# Patient Record
Sex: Female | Born: 1985 | Race: White | Hispanic: No | Smoking: Never smoker
Health system: Southern US, Community
[De-identification: ages and names within clinical notes are randomized; demographics above are authoritative.]

## PROBLEM LIST (undated history)

## (undated) DIAGNOSIS — B009 Herpesviral infection, unspecified: Secondary | ICD-10-CM

## (undated) DIAGNOSIS — E162 Hypoglycemia, unspecified: Secondary | ICD-10-CM

## (undated) DIAGNOSIS — F419 Anxiety disorder, unspecified: Secondary | ICD-10-CM

## (undated) DIAGNOSIS — Z973 Presence of spectacles and contact lenses: Secondary | ICD-10-CM

## (undated) DIAGNOSIS — D649 Anemia, unspecified: Secondary | ICD-10-CM

## (undated) DIAGNOSIS — J069 Acute upper respiratory infection, unspecified: Secondary | ICD-10-CM

## (undated) HISTORY — DX: Anxiety disorder, unspecified: F41.9

## (undated) HISTORY — PX: TONSILLECTOMY: SUR1361

---
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
## 2004-01-22 ENCOUNTER — Other Ambulatory Visit: Admission: RE | Admit: 2004-01-22 | Discharge: 2004-01-22 | Payer: Self-pay | Admitting: *Deleted

## 2006-02-07 ENCOUNTER — Other Ambulatory Visit: Admission: RE | Admit: 2006-02-07 | Discharge: 2006-02-07 | Payer: Self-pay | Admitting: *Deleted

## 2006-03-30 ENCOUNTER — Other Ambulatory Visit: Admission: RE | Admit: 2006-03-30 | Discharge: 2006-03-30 | Payer: Self-pay | Admitting: *Deleted

## 2006-08-02 ENCOUNTER — Other Ambulatory Visit: Admission: RE | Admit: 2006-08-02 | Discharge: 2006-08-02 | Payer: Self-pay | Admitting: *Deleted

## 2007-02-28 ENCOUNTER — Other Ambulatory Visit: Admission: RE | Admit: 2007-02-28 | Discharge: 2007-02-28 | Payer: Self-pay | Admitting: *Deleted

## 2007-04-13 ENCOUNTER — Other Ambulatory Visit: Admission: RE | Admit: 2007-04-13 | Discharge: 2007-04-13 | Payer: Self-pay | Admitting: *Deleted

## 2008-02-26 ENCOUNTER — Other Ambulatory Visit: Admission: RE | Admit: 2008-02-26 | Discharge: 2008-02-26 | Payer: Self-pay | Admitting: *Deleted

## 2012-07-03 ENCOUNTER — Ambulatory Visit: Payer: Self-pay | Admitting: Orthopedic Surgery

## 2012-07-26 NOTE — L&D Delivery Note (Signed)
Delivery Note At 7:57 PM a viable female was delivered via Vaginal, Spontaneous Delivery (Presentation: Right Occiput Anterior).  APGAR: , ; weight .   Placenta status:spont >>intact , .  Cord:  with the following complications: .  Cord pH: not sent  Anesthesia: Epidural  Episiotomy: None Lacerations: 2nd degree Suture Repair: 3.0 vicryl rapide Est. Blood Loss (mL): 300  Mom to postpartum.  Baby to nursery-stable.  Meriel Pica 04/16/2013, 8:23 PM

## 2013-01-22 ENCOUNTER — Ambulatory Visit: Payer: BC Managed Care – PPO | Attending: Obstetrics & Gynecology | Admitting: Physical Therapy

## 2013-01-22 DIAGNOSIS — R5381 Other malaise: Secondary | ICD-10-CM | POA: Insufficient documentation

## 2013-01-22 DIAGNOSIS — M546 Pain in thoracic spine: Secondary | ICD-10-CM | POA: Insufficient documentation

## 2013-01-22 DIAGNOSIS — IMO0001 Reserved for inherently not codable concepts without codable children: Secondary | ICD-10-CM | POA: Insufficient documentation

## 2013-01-23 ENCOUNTER — Ambulatory Visit: Payer: BC Managed Care – PPO | Admitting: Physical Therapy

## 2013-02-01 ENCOUNTER — Ambulatory Visit: Payer: BC Managed Care – PPO | Attending: Obstetrics & Gynecology | Admitting: Physical Therapy

## 2013-02-09 ENCOUNTER — Encounter: Payer: BC Managed Care – PPO | Admitting: Physical Therapy

## 2013-02-16 ENCOUNTER — Encounter: Payer: BC Managed Care – PPO | Admitting: Physical Therapy

## 2013-02-23 ENCOUNTER — Encounter: Payer: BC Managed Care – PPO | Admitting: Physical Therapy

## 2013-03-01 ENCOUNTER — Encounter: Payer: BC Managed Care – PPO | Admitting: Physical Therapy

## 2013-04-15 ENCOUNTER — Inpatient Hospital Stay (HOSPITAL_COMMUNITY)
Admission: AD | Admit: 2013-04-15 | Discharge: 2013-04-15 | Disposition: A | Discharge status: Home / Self Care | Payer: BC Managed Care – PPO | Source: Ambulatory Visit | Attending: Obstetrics & Gynecology | Admitting: Obstetrics & Gynecology

## 2013-04-15 ENCOUNTER — Encounter (HOSPITAL_COMMUNITY): Payer: Self-pay | Admitting: *Deleted

## 2013-04-15 DIAGNOSIS — O479 False labor, unspecified: Secondary | ICD-10-CM | POA: Insufficient documentation

## 2013-04-15 LAB — POCT FERN TEST: POCT Fern Test: NEGATIVE

## 2013-04-15 NOTE — MAU Note (Signed)
Pt reoprts uc's q 5-8 minutes

## 2013-04-15 NOTE — MAU Note (Signed)
Pt presents with complaint of contractions and a watery discharge, denies gush of fluid. Denies problems with pregnancy.

## 2013-04-16 ENCOUNTER — Inpatient Hospital Stay (HOSPITAL_COMMUNITY): Payer: BC Managed Care – PPO | Admitting: Anesthesiology

## 2013-04-16 ENCOUNTER — Encounter (HOSPITAL_COMMUNITY): Payer: Self-pay | Admitting: Anesthesiology

## 2013-04-16 ENCOUNTER — Inpatient Hospital Stay (HOSPITAL_COMMUNITY)
Admission: AD | Admit: 2013-04-16 | Discharge: 2013-04-18 | DRG: 373 | Disposition: A | Discharge status: Home / Self Care | Payer: BC Managed Care – PPO | Source: Ambulatory Visit | Attending: Obstetrics and Gynecology | Admitting: Obstetrics and Gynecology

## 2013-04-16 ENCOUNTER — Encounter (HOSPITAL_COMMUNITY): Payer: Self-pay | Admitting: *Deleted

## 2013-04-16 LAB — OB RESULTS CONSOLE ANTIBODY SCREEN: Antibody Screen: NEGATIVE

## 2013-04-16 LAB — CBC
HCT: 37.5 % (ref 36.0–46.0)
Hemoglobin: 13 g/dL (ref 12.0–15.0)
MCH: 29.8 pg (ref 26.0–34.0)
MCV: 86 fL (ref 78.0–100.0)
RDW: 13.3 % (ref 11.5–15.5)
WBC: 11.3 10*3/uL — ABNORMAL HIGH (ref 4.0–10.5)

## 2013-04-16 LAB — RPR: RPR Ser Ql: NONREACTIVE

## 2013-04-16 LAB — OB RESULTS CONSOLE ABO/RH

## 2013-04-16 LAB — OB RESULTS CONSOLE GBS: GBS: NEGATIVE

## 2013-04-16 LAB — OB RESULTS CONSOLE RPR: RPR: NONREACTIVE

## 2013-04-16 LAB — OB RESULTS CONSOLE HEPATITIS B SURFACE ANTIGEN: Hepatitis B Surface Ag: NEGATIVE

## 2013-04-16 LAB — OB RESULTS CONSOLE GC/CHLAMYDIA
Chlamydia: NEGATIVE
Gonorrhea: NEGATIVE

## 2013-04-16 LAB — OB RESULTS CONSOLE RUBELLA ANTIBODY, IGM: Rubella: IMMUNE

## 2013-04-16 MED ORDER — OXYTOCIN 40 UNITS IN LACTATED RINGERS INFUSION - SIMPLE MED
1.0000 m[IU]/min | INTRAVENOUS | Status: DC
  Administered 2013-04-16: 2 m[IU]/min via INTRAVENOUS

## 2013-04-16 MED ORDER — ONDANSETRON HCL 4 MG/2ML IJ SOLN
4.0000 mg | Freq: Four times a day (QID) | INTRAMUSCULAR | Status: DC | PRN
  Administered 2013-04-16: 4 mg via INTRAVENOUS
  Filled 2013-04-16 (×2): qty 2

## 2013-04-16 MED ORDER — BUPIVACAINE HCL (PF) 0.25 % IJ SOLN
INTRAMUSCULAR | Status: DC | PRN
  Administered 2013-04-16: 5 mL via EPIDURAL

## 2013-04-16 MED ORDER — ACETAMINOPHEN 325 MG PO TABS: 650.0000 mg | ORAL_TABLET | ORAL | Status: DC | PRN

## 2013-04-16 MED ORDER — ONDANSETRON HCL 4 MG PO TABS: 4.0000 mg | ORAL_TABLET | ORAL | Status: DC | PRN

## 2013-04-16 MED ORDER — LACTATED RINGERS IV SOLN: 500.0000 mL | INTRAVENOUS | Status: DC | PRN

## 2013-04-16 MED ORDER — BISACODYL 10 MG RE SUPP: 10.0000 mg | Freq: Every day | RECTAL | Status: DC | PRN

## 2013-04-16 MED ORDER — TETANUS-DIPHTH-ACELL PERTUSSIS 5-2.5-18.5 LF-MCG/0.5 IM SUSP: 0.5000 mL | Freq: Once | INTRAMUSCULAR | Status: DC

## 2013-04-16 MED ORDER — FLEET ENEMA 7-19 GM/118ML RE ENEM: 1.0000 | ENEMA | Freq: Every day | RECTAL | Status: DC | PRN

## 2013-04-16 MED ORDER — CITRIC ACID-SODIUM CITRATE 334-500 MG/5ML PO SOLN: 30.0000 mL | ORAL | Status: DC | PRN

## 2013-04-16 MED ORDER — SENNOSIDES-DOCUSATE SODIUM 8.6-50 MG PO TABS
2.0000 | ORAL_TABLET | ORAL | Status: DC
  Administered 2013-04-17: 2 via ORAL

## 2013-04-16 MED ORDER — IBUPROFEN 800 MG PO TABS
800.0000 mg | ORAL_TABLET | Freq: Three times a day (TID) | ORAL | Status: DC | PRN
  Administered 2013-04-16 – 2013-04-18 (×5): 800 mg via ORAL
  Filled 2013-04-16 (×6): qty 1

## 2013-04-16 MED ORDER — BENZOCAINE-MENTHOL 20-0.5 % EX AERO
1.0000 "application " | INHALATION_SPRAY | CUTANEOUS | Status: DC | PRN
  Administered 2013-04-17: 1 via TOPICAL
  Filled 2013-04-16: qty 56

## 2013-04-16 MED ORDER — WITCH HAZEL-GLYCERIN EX PADS: 1.0000 "application " | MEDICATED_PAD | CUTANEOUS | Status: DC | PRN

## 2013-04-16 MED ORDER — SODIUM CHLORIDE 0.9 % IJ SOLN: 3.0000 mL | INTRAMUSCULAR | Status: DC | PRN

## 2013-04-16 MED ORDER — OXYCODONE-ACETAMINOPHEN 5-325 MG PO TABS: 1.0000 | ORAL_TABLET | Freq: Four times a day (QID) | ORAL | Status: DC | PRN

## 2013-04-16 MED ORDER — EPHEDRINE 5 MG/ML INJ
10.0000 mg | INTRAVENOUS | Status: DC | PRN
  Filled 2013-04-16: qty 2

## 2013-04-16 MED ORDER — DIPHENHYDRAMINE HCL 50 MG/ML IJ SOLN: 12.5000 mg | INTRAMUSCULAR | Status: DC | PRN

## 2013-04-16 MED ORDER — OXYTOCIN BOLUS FROM INFUSION
500.0000 mL | INTRAVENOUS | Status: DC
  Administered 2013-04-16: 500 mL via INTRAVENOUS

## 2013-04-16 MED ORDER — PHENYLEPHRINE 40 MCG/ML (10ML) SYRINGE FOR IV PUSH (FOR BLOOD PRESSURE SUPPORT)
80.0000 ug | PREFILLED_SYRINGE | INTRAVENOUS | Status: DC | PRN
  Filled 2013-04-16: qty 2
  Filled 2013-04-16 (×2): qty 5

## 2013-04-16 MED ORDER — LANOLIN HYDROUS EX OINT: TOPICAL_OINTMENT | CUTANEOUS | Status: DC | PRN

## 2013-04-16 MED ORDER — DIBUCAINE 1 % RE OINT: 1.0000 "application " | TOPICAL_OINTMENT | RECTAL | Status: DC | PRN

## 2013-04-16 MED ORDER — DIPHENHYDRAMINE HCL 25 MG PO CAPS: 25.0000 mg | ORAL_CAPSULE | Freq: Four times a day (QID) | ORAL | Status: DC | PRN

## 2013-04-16 MED ORDER — OXYCODONE-ACETAMINOPHEN 5-325 MG PO TABS: 1.0000 | ORAL_TABLET | ORAL | Status: DC | PRN

## 2013-04-16 MED ORDER — IBUPROFEN 600 MG PO TABS: 600.0000 mg | ORAL_TABLET | Freq: Four times a day (QID) | ORAL | Status: DC | PRN

## 2013-04-16 MED ORDER — LACTATED RINGERS IV SOLN
INTRAVENOUS | Status: DC
  Administered 2013-04-16 (×3): via INTRAVENOUS

## 2013-04-16 MED ORDER — EPHEDRINE 5 MG/ML INJ
10.0000 mg | INTRAVENOUS | Status: DC | PRN
  Filled 2013-04-16: qty 4
  Filled 2013-04-16: qty 2
  Filled 2013-04-16: qty 4

## 2013-04-16 MED ORDER — LACTATED RINGERS IV SOLN: 500.0000 mL | Freq: Once | INTRAVENOUS | Status: DC

## 2013-04-16 MED ORDER — PHENYLEPHRINE 40 MCG/ML (10ML) SYRINGE FOR IV PUSH (FOR BLOOD PRESSURE SUPPORT)
80.0000 ug | PREFILLED_SYRINGE | INTRAVENOUS | Status: DC | PRN
  Filled 2013-04-16: qty 2

## 2013-04-16 MED ORDER — FENTANYL 2.5 MCG/ML BUPIVACAINE 1/10 % EPIDURAL INFUSION (WH - ANES)
14.0000 mL/h | INTRAMUSCULAR | Status: DC | PRN
  Administered 2013-04-16 (×2): 14 mL/h via EPIDURAL
  Filled 2013-04-16 (×2): qty 125

## 2013-04-16 MED ORDER — ZOLPIDEM TARTRATE 5 MG PO TABS: 5.0000 mg | ORAL_TABLET | Freq: Every evening | ORAL | Status: DC | PRN

## 2013-04-16 MED ORDER — LIDOCAINE HCL (PF) 1 % IJ SOLN
INTRAMUSCULAR | Status: DC | PRN
  Administered 2013-04-16 (×2): 5 mL

## 2013-04-16 MED ORDER — ONDANSETRON HCL 4 MG/2ML IJ SOLN: 4.0000 mg | INTRAMUSCULAR | Status: DC | PRN

## 2013-04-16 MED ORDER — OXYTOCIN 40 UNITS IN LACTATED RINGERS INFUSION - SIMPLE MED
62.5000 mL/h | INTRAVENOUS | Status: DC
  Administered 2013-04-16: 62.5 mL/h via INTRAVENOUS
  Filled 2013-04-16: qty 1000

## 2013-04-16 MED ORDER — SIMETHICONE 80 MG PO CHEW: 80.0000 mg | CHEWABLE_TABLET | ORAL | Status: DC | PRN

## 2013-04-16 MED ORDER — LIDOCAINE HCL (PF) 1 % IJ SOLN
30.0000 mL | INTRAMUSCULAR | Status: DC | PRN
  Filled 2013-04-16 (×2): qty 30

## 2013-04-16 MED ORDER — TERBUTALINE SULFATE 1 MG/ML IJ SOLN: 0.2500 mg | Freq: Once | INTRAMUSCULAR | Status: DC | PRN

## 2013-04-16 MED ORDER — MEASLES, MUMPS & RUBELLA VAC ~~LOC~~ INJ: 0.5000 mL | INJECTION | Freq: Once | SUBCUTANEOUS | Status: DC

## 2013-04-16 MED ORDER — HYDROCORTISONE 1 % EX CREA
TOPICAL_CREAM | Freq: Three times a day (TID) | CUTANEOUS | Status: DC | PRN
  Filled 2013-04-16: qty 28

## 2013-04-16 MED ORDER — PRENATAL MULTIVITAMIN CH
1.0000 | ORAL_TABLET | Freq: Every day | ORAL | Status: DC
  Administered 2013-04-17: 1 via ORAL
  Filled 2013-04-16: qty 1

## 2013-04-16 NOTE — H&P (Signed)
NAME:  Taylor Henry, Taylor Henry             ACCOUNT NO.:  [id number]  MEDICAL RECORD NO.:  [id number]  LOCATION:  9166                          FACILITY:  [facility]  PHYSICIAN:  Duke Salvia. Marcelle Overlie, M.D.DATE OF BIRTH:  12/04/1985  DATE OF ADMISSION:  04/16/2013 DATE OF DISCHARGE:                             HISTORY & PHYSICAL   CHIEF COMPLAINT:  Rupture of membranes with early labor at term.  HPI:  A 26 year old, G1, P0, EDD September 20, presents with SROM, clear fluid, early labor, uncomplicated pregnancy with GBS negative.  PAST MEDICAL HISTORY:  Please see the [facility] form for details.  ALLERGIES:  Penicillin and codeine.  PHYSICAL EXAMINATION:  VITAL SIGNS:  Temp 98.2, blood pressure 120/78. HEENT:  Unremarkable. NECK:  Supple without masses. LUNGS:  Clear. CARDIOVASCULAR:  Regular rate and rhythm without murmurs, rubs, gallops. BREASTS:  Not examined.  Term fundal height.  Fetal heart rate 140, cervix was 2, 50%, copious clear fluid, vertex. EXTREMITIES:  Unremarkable. NEUROLOGIC:  Unremarkable.  IMPRESSION:  Term pregnancy, spontaneous rupture of membranes with early labor.     Kyaire Gruenewald M. Marcelle Overlie, M.D.     [facility]/MEDQ  D:  04/16/2013  T:  04/16/2013  Job:  [id number]

## 2013-04-16 NOTE — Anesthesia Preprocedure Evaluation (Signed)

## 2013-04-16 NOTE — MAU Note (Signed)
Patient states she started leaking clear fluid at 0930 and continues to leak. Pants saturated on arrival to MAU.  On arrival to MAU. Patient states she is having contractions every 2 minutes. Reports good fetal movement.

## 2013-04-16 NOTE — Anesthesia Procedure Notes (Signed)

## 2013-04-16 NOTE — H&P (Signed)
Taylor Henry  DICTATION # [id number] CSN# [id number]   Meriel Pica, MD 04/16/2013 1:04 PM

## 2013-04-16 NOTE — MAU Note (Signed)
Report given to Erin, CN on BS. Patient will be a throughput to room 166.

## 2013-04-16 NOTE — Progress Notes (Signed)
Now 3/C/-1, clr AF, has epid>>comfortable

## 2013-04-17 LAB — CBC
HCT: 33.6 % — ABNORMAL LOW (ref 36.0–46.0)
Platelets: 155 10*3/uL (ref 150–400)
RBC: 3.85 MIL/uL — ABNORMAL LOW (ref 3.87–5.11)
WBC: 15.6 10*3/uL — ABNORMAL HIGH (ref 4.0–10.5)

## 2013-04-17 NOTE — Anesthesia Postprocedure Evaluation (Signed)
Anesthesia Post Note  Patient: Taylor Henry  Procedure(s) Performed: * No procedures listed *  Anesthesia type: Epidural  Patient location: Mother/Baby  Post pain: Pain level controlled  Post assessment: Post-op Vital signs reviewed  Last Vitals:  Filed Vitals:   04/17/13 0628  BP: 109/71  Pulse: 78  Temp: 36.6 C  Resp: 18    Post vital signs: Reviewed  Level of consciousness:alert  Complications: No apparent anesthesia complications 

## 2013-04-17 NOTE — Anesthesia Postprocedure Evaluation (Signed)
Anesthesia Post Note  Patient: Taylor Henry  Procedure(s) Performed: * No procedures listed *  Anesthesia type: Epidural  Patient location: Mother/Baby  Post pain: Pain level controlled  Post assessment: Post-op Vital signs reviewed  Last Vitals:  Filed Vitals:   04/17/13 0628  BP: 109/71  Pulse: 78  Temp: 36.6 C  Resp: 18    Post vital signs: Reviewed  Level of consciousness:alert  Complications: No apparent anesthesia complications

## 2013-04-17 NOTE — Progress Notes (Signed)
Post Partum Day 1 Subjective: no complaints, up ad lib, voiding and tolerating PO  Objective: Blood pressure 109/71, pulse 78, temperature 97.9 F (36.6 C), temperature source Oral, resp. rate 18, height 5\' 6"  (1.676 m), weight 181 lb (82.101 kg), SpO2 95.00%, unknown if currently breastfeeding.  Physical Exam:  General: alert and cooperative Lochia: appropriate Uterine Fundus: firm Incision: perineum intact DVT Evaluation: No evidence of DVT seen on physical exam. Negative Homan's sign. No cords or calf tenderness.   Recent Labs  04/16/13 1130 04/17/13 0600  HGB 13.0 11.5*  HCT 37.5 33.6*    Assessment/Plan: Plan for discharge tomorrow   LOS: 1 day   CURTIS,CAROL G 04/17/2013, 8:40 AM

## 2013-04-18 ENCOUNTER — Inpatient Hospital Stay (HOSPITAL_COMMUNITY): Admission: RE | Admit: 2013-04-18 | Payer: BC Managed Care – PPO | Source: Ambulatory Visit

## 2013-04-18 MED ORDER — IBUPROFEN 800 MG PO TABS
800.0000 mg | ORAL_TABLET | Freq: Three times a day (TID) | ORAL | Status: DC | PRN
Start: 2013-04-18 — End: 2016-03-27

## 2013-04-18 MED ORDER — OXYCODONE-ACETAMINOPHEN 5-325 MG PO TABS: 1.0000 | ORAL_TABLET | Freq: Four times a day (QID) | ORAL | Status: DC | PRN

## 2013-04-18 NOTE — Discharge Summary (Signed)
Obstetric Discharge Summary Reason for Admission: onset of labor and rupture of membranes Prenatal Procedures: ultrasound Intrapartum Procedures: spontaneous vaginal delivery Postpartum Procedures: none Complications-Operative and Postpartum: 2 degree perineal laceration Hemoglobin  Date Value Range Status  04/17/2013 11.5* 12.0 - 15.0 g/dL Final     HCT  Date Value Range Status  04/17/2013 33.6* 36.0 - 46.0 % Final    Physical Exam:  General: alert and cooperative Lochia: appropriate Uterine Fundus: firm Incision: perineum intact DVT Evaluation: No evidence of DVT seen on physical exam. Negative Homan's sign. No cords or calf tenderness. No significant calf/ankle edema.  Discharge Diagnoses: Term Pregnancy-delivered  Discharge Information: Date: 04/18/2013 Activity: pelvic rest Diet: routine Medications: PNV, Ibuprofen and Percocet Condition: stable Instructions: refer to practice specific booklet Discharge to: home   Newborn Data: Live born female  Birth Weight: 8 lb 0.8 oz (3650 g) APGAR: 7, 9  Home with mother.  Taylor Henry G 04/18/2013, 8:33 AM

## 2014-05-27 ENCOUNTER — Encounter (HOSPITAL_COMMUNITY): Payer: Self-pay | Admitting: *Deleted

## 2015-07-27 NOTE — L&D Delivery Note (Signed)
Delivery Note At 5:50 PM a viable female was delivered via Vaginal, Spontaneous Delivery (presentation: vertex).  APGAR: 9, 9; weight 9 lb 10.3 oz (4374 g).   Placenta status: routine Tight Nuchal Cord: delivered through  With no complications.   Anesthesia:   Episiotomy: None Lacerations: 2nd degree Suture Repair: 2.0 3.0 vicryl rapide Est. Blood Loss (mL): 300  Mom to postpartum.  Baby to Couplet care / Skin to Skin.  It's a boy "Taylor Henry"!!  [name] [name] 03/26/2016, 6:53 PM

## 2015-09-03 LAB — OB RESULTS CONSOLE HEPATITIS B SURFACE ANTIGEN: HEP B S AG: NEGATIVE

## 2015-09-03 LAB — OB RESULTS CONSOLE ANTIBODY SCREEN: Antibody Screen: NEGATIVE

## 2015-09-03 LAB — OB RESULTS CONSOLE RUBELLA ANTIBODY, IGM: RUBELLA: IMMUNE

## 2015-09-03 LAB — OB RESULTS CONSOLE ABO/RH: RH Type: POSITIVE

## 2015-09-03 LAB — OB RESULTS CONSOLE RPR: RPR: NONREACTIVE

## 2015-09-03 LAB — OB RESULTS CONSOLE HIV ANTIBODY (ROUTINE TESTING): HIV: NONREACTIVE

## 2015-09-03 LAB — OB RESULTS CONSOLE GC/CHLAMYDIA
CHLAMYDIA, DNA PROBE: NEGATIVE
GC PROBE AMP, GENITAL: NEGATIVE

## 2015-10-31 DIAGNOSIS — Z3A18 18 weeks gestation of pregnancy: Secondary | ICD-10-CM | POA: Diagnosis not present

## 2015-10-31 DIAGNOSIS — Z36 Encounter for antenatal screening of mother: Secondary | ICD-10-CM | POA: Diagnosis not present

## 2015-12-29 DIAGNOSIS — D509 Iron deficiency anemia, unspecified: Secondary | ICD-10-CM | POA: Diagnosis not present

## 2015-12-29 DIAGNOSIS — Z3A27 27 weeks gestation of pregnancy: Secondary | ICD-10-CM | POA: Diagnosis not present

## 2015-12-29 DIAGNOSIS — O4402 Placenta previa specified as without hemorrhage, second trimester: Secondary | ICD-10-CM | POA: Diagnosis not present

## 2015-12-29 DIAGNOSIS — Z23 Encounter for immunization: Secondary | ICD-10-CM | POA: Diagnosis not present

## 2015-12-29 DIAGNOSIS — Z36 Encounter for antenatal screening of mother: Secondary | ICD-10-CM | POA: Diagnosis not present

## 2016-02-11 DIAGNOSIS — Z3A33 33 weeks gestation of pregnancy: Secondary | ICD-10-CM | POA: Diagnosis not present

## 2016-02-11 DIAGNOSIS — O3663X0 Maternal care for excessive fetal growth, third trimester, not applicable or unspecified: Secondary | ICD-10-CM | POA: Diagnosis not present

## 2016-02-25 DIAGNOSIS — Z36 Encounter for antenatal screening of mother: Secondary | ICD-10-CM | POA: Diagnosis not present

## 2016-02-25 DIAGNOSIS — Z3A35 35 weeks gestation of pregnancy: Secondary | ICD-10-CM | POA: Diagnosis not present

## 2016-03-10 DIAGNOSIS — O3663X0 Maternal care for excessive fetal growth, third trimester, not applicable or unspecified: Secondary | ICD-10-CM | POA: Diagnosis not present

## 2016-03-10 DIAGNOSIS — Z3A37 37 weeks gestation of pregnancy: Secondary | ICD-10-CM | POA: Diagnosis not present

## 2016-03-24 ENCOUNTER — Telehealth (HOSPITAL_COMMUNITY): Payer: Self-pay | Admitting: *Deleted

## 2016-03-24 ENCOUNTER — Encounter (HOSPITAL_COMMUNITY): Payer: Self-pay | Admitting: *Deleted

## 2016-03-24 LAB — OB RESULTS CONSOLE GBS: GBS: POSITIVE

## 2016-03-24 NOTE — Telephone Encounter (Signed)
Preadmission screen  

## 2016-03-25 ENCOUNTER — Encounter (HOSPITAL_COMMUNITY): Payer: Self-pay

## 2016-03-25 NOTE — H&P (Signed)
Taylor Henry is a 30 y.o. female presenting for multip IOL w/favorable cervix. Prior NSVD in 2014 of 8#0 infant. OB History    Gravida Para Term Preterm AB Living   2 1 1     1    SAB TAB Ectopic Multiple Live Births           1     Past Medical History:  Diagnosis Date  . HSV (herpes simplex virus) infection    ORAL   Past Surgical History:  Procedure Laterality Date  . ADENOIDECTOMY  1991  . TONSILLECTOMY     Family History: family history includes Heart disease in her paternal grandmother; Hodgkin's lymphoma in her mother; Hyperlipidemia in her father; Hypertension in her father and paternal grandmother. Social History:  reports that she has never smoked. She has never used smokeless tobacco. She reports that she does not drink alcohol or use drugs.     Maternal Diabetes: No Genetic Screening: Normal Maternal Ultrasounds/Referrals: Normal, LLP resolved Fetal Ultrasounds or other Referrals:  None Maternal Substance Abuse:  No Significant Maternal Medications:  None Significant Maternal Lab Results:  None Other Comments:  None  ROS WNL History here for IOL   Last menstrual period 06/22/2015, unknown if currently breastfeeding. Exam Physical Exam  OBGyn Exam  (from clinic) NAD, A&O NWOB Abd soft, nondistended, gravid  Prenatal labs: ABO, Rh: O/Positive/-- (02/08 0000) Antibody: Negative (02/08 0000) Rubella: Immune (02/08 0000) RPR: Nonreactive (02/08 0000)  HBsAg: Negative (02/08 0000)  HIV: Non-reactive (02/08 0000)  GBS: Positive (08/30 0000)   Assessment/Plan: A/P: Pt is a G2P1001 here for scheduled multip IOL.  IOL: favorable cervix - SVE 3/25/-3 in office. Plan for pitocin/AROM MWB: prior FT NSVD 8#0, otherwise no sig med/surg issues GBS pos, PCN allergy, plan for clindamycin as it is sens to it.  Taylor Henry   Taylor Henry 03/25/2016, 4:50 PM

## 2016-03-26 ENCOUNTER — Inpatient Hospital Stay (HOSPITAL_COMMUNITY): Payer: BLUE CROSS/BLUE SHIELD | Admitting: Anesthesiology

## 2016-03-26 ENCOUNTER — Inpatient Hospital Stay (HOSPITAL_COMMUNITY)
Admission: RE | Admit: 2016-03-26 | Discharge: 2016-03-27 | DRG: 775 | Disposition: A | Discharge status: Home / Self Care | Payer: BLUE CROSS/BLUE SHIELD | Source: Ambulatory Visit | Attending: Obstetrics and Gynecology | Admitting: Obstetrics and Gynecology

## 2016-03-26 DIAGNOSIS — Z8249 Family history of ischemic heart disease and other diseases of the circulatory system: Secondary | ICD-10-CM | POA: Diagnosis not present

## 2016-03-26 DIAGNOSIS — O99824 Streptococcus B carrier state complicating childbirth: Secondary | ICD-10-CM | POA: Diagnosis not present

## 2016-03-26 DIAGNOSIS — Z23 Encounter for immunization: Secondary | ICD-10-CM | POA: Diagnosis not present

## 2016-03-26 DIAGNOSIS — Z880 Allergy status to penicillin: Secondary | ICD-10-CM

## 2016-03-26 DIAGNOSIS — Z3483 Encounter for supervision of other normal pregnancy, third trimester: Secondary | ICD-10-CM | POA: Diagnosis present

## 2016-03-26 DIAGNOSIS — Z3490 Encounter for supervision of normal pregnancy, unspecified, unspecified trimester: Secondary | ICD-10-CM

## 2016-03-26 DIAGNOSIS — Z3A39 39 weeks gestation of pregnancy: Secondary | ICD-10-CM | POA: Diagnosis not present

## 2016-03-26 DIAGNOSIS — Z3A40 40 weeks gestation of pregnancy: Secondary | ICD-10-CM | POA: Diagnosis not present

## 2016-03-26 HISTORY — DX: Herpesviral infection, unspecified: B00.9

## 2016-03-26 LAB — CBC
HCT: 32.8 % — ABNORMAL LOW (ref 36.0–46.0)
HEMOGLOBIN: 11.1 g/dL — AB (ref 12.0–15.0)
MCH: 27.8 pg (ref 26.0–34.0)
MCHC: 33.8 g/dL (ref 30.0–36.0)
MCV: 82.2 fL (ref 78.0–100.0)
Platelets: 207 10*3/uL (ref 150–400)
RBC: 3.99 MIL/uL (ref 3.87–5.11)
RDW: 13.8 % (ref 11.5–15.5)
WBC: 7.6 10*3/uL (ref 4.0–10.5)

## 2016-03-26 LAB — ABO/RH: ABO/RH(D): O POS

## 2016-03-26 LAB — TYPE AND SCREEN
ABO/RH(D): O POS
Antibody Screen: NEGATIVE

## 2016-03-26 MED ORDER — WITCH HAZEL-GLYCERIN EX PADS: 1.0000 "application " | MEDICATED_PAD | CUTANEOUS | Status: DC | PRN

## 2016-03-26 MED ORDER — LACTATED RINGERS IV SOLN
INTRAVENOUS | Status: DC
  Administered 2016-03-26: 1000 mL via INTRAVENOUS

## 2016-03-26 MED ORDER — SOD CITRATE-CITRIC ACID 500-334 MG/5ML PO SOLN: 30.0000 mL | ORAL | Status: DC | PRN

## 2016-03-26 MED ORDER — DIPHENHYDRAMINE HCL 25 MG PO CAPS: 25.0000 mg | ORAL_CAPSULE | Freq: Four times a day (QID) | ORAL | Status: DC | PRN

## 2016-03-26 MED ORDER — COCONUT OIL OIL: 1.0000 "application " | TOPICAL_OIL | Status: DC | PRN

## 2016-03-26 MED ORDER — ACETAMINOPHEN 325 MG PO TABS: 650.0000 mg | ORAL_TABLET | ORAL | Status: DC | PRN

## 2016-03-26 MED ORDER — OXYCODONE-ACETAMINOPHEN 5-325 MG PO TABS: 2.0000 | ORAL_TABLET | ORAL | Status: DC | PRN

## 2016-03-26 MED ORDER — TETANUS-DIPHTH-ACELL PERTUSSIS 5-2.5-18.5 LF-MCG/0.5 IM SUSP: 0.5000 mL | Freq: Once | INTRAMUSCULAR | Status: DC

## 2016-03-26 MED ORDER — LACTATED RINGERS IV SOLN: 500.0000 mL | INTRAVENOUS | Status: DC | PRN

## 2016-03-26 MED ORDER — OXYCODONE-ACETAMINOPHEN 5-325 MG PO TABS
1.0000 | ORAL_TABLET | ORAL | Status: DC | PRN
Start: 2016-03-26 — End: 2016-03-27

## 2016-03-26 MED ORDER — FENTANYL 2.5 MCG/ML BUPIVACAINE 1/10 % EPIDURAL INFUSION (WH - ANES)
14.0000 mL/h | INTRAMUSCULAR | Status: DC | PRN
  Administered 2016-03-26: 14 mL/h via EPIDURAL
  Filled 2016-03-26 (×2): qty 125

## 2016-03-26 MED ORDER — PRENATAL MULTIVITAMIN CH
1.0000 | ORAL_TABLET | Freq: Every day | ORAL | Status: DC
  Administered 2016-03-27: 1 via ORAL
  Filled 2016-03-26: qty 1

## 2016-03-26 MED ORDER — EPHEDRINE 5 MG/ML INJ
10.0000 mg | INTRAVENOUS | Status: DC | PRN
  Filled 2016-03-26: qty 4

## 2016-03-26 MED ORDER — OXYTOCIN BOLUS FROM INFUSION
500.0000 mL | Freq: Once | INTRAVENOUS | Status: AC
  Administered 2016-03-26: 500 mL via INTRAVENOUS

## 2016-03-26 MED ORDER — CLINDAMYCIN PHOSPHATE 900 MG/50ML IV SOLN
900.0000 mg | Freq: Three times a day (TID) | INTRAVENOUS | Status: DC
  Administered 2016-03-26: 900 mg via INTRAVENOUS
  Filled 2016-03-26 (×2): qty 50

## 2016-03-26 MED ORDER — LIDOCAINE HCL (PF) 1 % IJ SOLN
30.0000 mL | INTRAMUSCULAR | Status: DC | PRN
  Filled 2016-03-26: qty 30

## 2016-03-26 MED ORDER — SENNOSIDES-DOCUSATE SODIUM 8.6-50 MG PO TABS
2.0000 | ORAL_TABLET | ORAL | Status: DC
  Administered 2016-03-26: 2 via ORAL
  Filled 2016-03-26: qty 2

## 2016-03-26 MED ORDER — OXYTOCIN 40 UNITS IN LACTATED RINGERS INFUSION - SIMPLE MED: 2.5000 [IU]/h | INTRAVENOUS | Status: DC

## 2016-03-26 MED ORDER — LIDOCAINE HCL (PF) 1 % IJ SOLN
INTRAMUSCULAR | Status: DC | PRN
  Administered 2016-03-26 (×2): 6 mL

## 2016-03-26 MED ORDER — ONDANSETRON HCL 4 MG/2ML IJ SOLN: 4.0000 mg | INTRAMUSCULAR | Status: DC | PRN

## 2016-03-26 MED ORDER — PHENYLEPHRINE 40 MCG/ML (10ML) SYRINGE FOR IV PUSH (FOR BLOOD PRESSURE SUPPORT)
80.0000 ug | PREFILLED_SYRINGE | INTRAVENOUS | Status: DC | PRN
  Filled 2016-03-26: qty 5
  Filled 2016-03-26: qty 10

## 2016-03-26 MED ORDER — ONDANSETRON HCL 4 MG/2ML IJ SOLN: 4.0000 mg | Freq: Four times a day (QID) | INTRAMUSCULAR | Status: DC | PRN

## 2016-03-26 MED ORDER — DIPHENHYDRAMINE HCL 50 MG/ML IJ SOLN: 12.5000 mg | INTRAMUSCULAR | Status: DC | PRN

## 2016-03-26 MED ORDER — IBUPROFEN 600 MG PO TABS
600.0000 mg | ORAL_TABLET | Freq: Four times a day (QID) | ORAL | Status: DC
  Administered 2016-03-26 – 2016-03-27 (×4): 600 mg via ORAL
  Filled 2016-03-26 (×4): qty 1

## 2016-03-26 MED ORDER — ZOLPIDEM TARTRATE 5 MG PO TABS: 5.0000 mg | ORAL_TABLET | Freq: Every evening | ORAL | Status: DC | PRN

## 2016-03-26 MED ORDER — DIBUCAINE 1 % RE OINT: 1.0000 "application " | TOPICAL_OINTMENT | RECTAL | Status: DC | PRN

## 2016-03-26 MED ORDER — LACTATED RINGERS IV SOLN
500.0000 mL | Freq: Once | INTRAVENOUS | Status: AC
  Administered 2016-03-26: 500 mL via INTRAVENOUS

## 2016-03-26 MED ORDER — BENZOCAINE-MENTHOL 20-0.5 % EX AERO
1.0000 "application " | INHALATION_SPRAY | CUTANEOUS | Status: DC | PRN
  Administered 2016-03-27: 1 via TOPICAL
  Filled 2016-03-26: qty 56

## 2016-03-26 MED ORDER — OXYCODONE-ACETAMINOPHEN 5-325 MG PO TABS: 1.0000 | ORAL_TABLET | ORAL | Status: DC | PRN

## 2016-03-26 MED ORDER — ONDANSETRON HCL 4 MG PO TABS: 4.0000 mg | ORAL_TABLET | ORAL | Status: DC | PRN

## 2016-03-26 MED ORDER — OXYTOCIN 40 UNITS IN LACTATED RINGERS INFUSION - SIMPLE MED
1.0000 m[IU]/min | INTRAVENOUS | Status: DC
  Administered 2016-03-26: 2 m[IU]/min via INTRAVENOUS
  Filled 2016-03-26: qty 1000

## 2016-03-26 MED ORDER — TERBUTALINE SULFATE 1 MG/ML IJ SOLN
0.2500 mg | Freq: Once | INTRAMUSCULAR | Status: DC | PRN
  Filled 2016-03-26: qty 1

## 2016-03-26 MED ORDER — PHENYLEPHRINE 40 MCG/ML (10ML) SYRINGE FOR IV PUSH (FOR BLOOD PRESSURE SUPPORT)
80.0000 ug | PREFILLED_SYRINGE | INTRAVENOUS | Status: DC | PRN
  Filled 2016-03-26: qty 5

## 2016-03-26 MED ORDER — LACTATED RINGERS IV SOLN: 500.0000 mL | Freq: Once | INTRAVENOUS | Status: DC

## 2016-03-26 MED ORDER — SIMETHICONE 80 MG PO CHEW: 80.0000 mg | CHEWABLE_TABLET | ORAL | Status: DC | PRN

## 2016-03-26 NOTE — Anesthesia Pain Management Evaluation Note (Signed)
  CRNA Pain Management Visit Note  Patient: Taylor Henry, 30 y.o., female  "Hello I am a [facility] of the [facility]. We have an anesthesia team available at all times to provide care throughout the hospital, including epidural management and anesthesia for C-section. I don't know your plan for the delivery whether it a natural birth, water birth, IV sedation, nitrous supplementation, doula or epidural, but we want to meet your pain goals."   1.Was your pain managed to your expectations on prior hospitalizations?   Yes   2.What is your expectation for pain management during this hospitalization?     Natural or Epidural  3.How can we help you reach that goal? Patient plans natural until can not tolerate and will get epidural  Record the patient's initial score and the patient's pain goal.   Pain: 7  Pain Goal: 10 [facility] wants you to be able to say your pain was always managed very well.  Rica RecordsICKELTON,Tasnia Spegal 03/26/2016

## 2016-03-26 NOTE — Anesthesia Preprocedure Evaluation (Signed)
Anesthesia Evaluation  Patient identified by MRN, date of birth, ID band Patient awake    Reviewed: Allergy & Precautions, NPO status , Patient's Chart, lab work & pertinent test results  Airway Mallampati: II  TM Distance: >3 FB Neck ROM: Full    Dental no notable dental hx.    Pulmonary neg pulmonary ROS,    Pulmonary exam normal breath sounds clear to auscultation       Cardiovascular negative cardio ROS Normal cardiovascular exam Rhythm:Regular Rate:Normal     Neuro/Psych negative neurological ROS  negative psych ROS   GI/Hepatic negative GI ROS, Neg liver ROS,   Endo/Other  negative endocrine ROS  Renal/GU negative Renal ROS  negative genitourinary   Musculoskeletal negative musculoskeletal ROS (+)   Abdominal   Peds negative pediatric ROS (+)  Hematology negative hematology ROS (+)   Anesthesia Other Findings   Reproductive/Obstetrics negative OB ROS                             Anesthesia Physical Anesthesia Plan  ASA: II  Anesthesia Plan: Epidural   Post-op Pain Management:    Induction: Intravenous  Airway Management Planned: Natural Airway  Additional Equipment:   Intra-op Plan:   Post-operative Plan:   Informed Consent: I have reviewed the patients History and Physical, chart, labs and discussed the procedure including the risks, benefits and alternatives for the proposed anesthesia with the patient or authorized representative who has indicated his/her understanding and acceptance.   Dental advisory given  Plan Discussed with: CRNA  Anesthesia Plan Comments: (Informed consent obtained prior to proceeding including risk of failure, 1% risk of PDPH, risk of minor discomfort and bruising.  Discussed rare but serious complications including epidural abscess, permanent nerve injury, epidural hematoma.  Discussed alternatives to epidural analgesia and patient desires  to proceed.  Timeout performed pre-procedure verifying patient name, procedure, and platelet count.  Patient tolerated procedure well.)        Anesthesia Quick Evaluation  

## 2016-03-26 NOTE — Anesthesia Procedure Notes (Signed)
Epidural Patient location during procedure: OB Start time: 03/26/2016 3:27 PM  Staffing Anesthesiologist: Sherrian DiversENENNY, Kalyse Meharg  Preanesthetic Checklist Completed: patient identified, site marked, surgical consent, pre-op evaluation, timeout performed, IV checked, risks and benefits discussed and monitors and equipment checked  Epidural Patient position: sitting Prep: DuraPrep Patient monitoring: blood pressure and heart rate Approach: midline Location: L4-L5 Injection technique: LOR saline  Needle:  Needle type: Tuohy  Needle gauge: 17 G Needle length: 9 cm Needle insertion depth: 6 cm Catheter type: closed end flexible Catheter size: 19 Gauge Catheter at skin depth: 14 cm Test dose: negative and Other  Assessment Events: blood not aspirated, injection not painful, no injection resistance, negative IV test and no paresthesia  Additional Notes Reason for block:procedure for pain

## 2016-03-26 NOTE — Progress Notes (Signed)
Dr Elon SpannerLeger called at 2248 about pt passing an apricot blood sized clot when up to void; pt was firm, 1 below U, with small amount of bleeding;  no new orders given.

## 2016-03-26 NOTE — Progress Notes (Signed)
S: Feeling strong ctxn. Wants cle soon.  O:  Vitals  Vitals:   03/26/16 1015 03/26/16 1227  BP: 128/86   Pulse: 85   Resp: 16 18  Temp: 98.1 F (36.7 C) 98.2 F (36.8 C)  TempSrc: Oral Oral  Weight: 90.7 kg (200 lb)   Height: 5\' 6"  (1.676 m)     Gen: NAD SVE:4/50/-2, vertex FHT: cat 1 Toco: q 2-4 min  A/P: G2P1001 here for scheduled multip IOL.  IOL: pitocin, s/p AROM for clear fluid MWB: prior FT NSVD 8#0, otherwise no sig med/surg issues GBS pos, PCN allergy, cont clinda  Belva AgeeElise Naija Troost MD

## 2016-03-27 LAB — CBC
HEMATOCRIT: 27.7 % — AB (ref 36.0–46.0)
Hemoglobin: 9.2 g/dL — ABNORMAL LOW (ref 12.0–15.0)
MCH: 27.4 pg (ref 26.0–34.0)
MCHC: 33.2 g/dL (ref 30.0–36.0)
MCV: 82.4 fL (ref 78.0–100.0)
Platelets: 158 10*3/uL (ref 150–400)
RBC: 3.36 MIL/uL — ABNORMAL LOW (ref 3.87–5.11)
RDW: 13.9 % (ref 11.5–15.5)
WBC: 11.6 10*3/uL — AB (ref 4.0–10.5)

## 2016-03-27 LAB — RPR: RPR: NONREACTIVE

## 2016-03-27 MED ORDER — IBUPROFEN 600 MG PO TABS: 600.0000 mg | ORAL_TABLET | Freq: Four times a day (QID) | ORAL | 0 refills | Status: DC | PRN

## 2016-03-27 MED ORDER — DOCUSATE SODIUM 100 MG PO CAPS: 100.0000 mg | ORAL_CAPSULE | Freq: Two times a day (BID) | ORAL | 2 refills | Status: DC

## 2016-03-27 MED ORDER — FERROUS SULFATE 325 (65 FE) MG PO TBEC: 325.0000 mg | DELAYED_RELEASE_TABLET | Freq: Two times a day (BID) | ORAL | 2 refills | Status: DC

## 2016-03-27 NOTE — Anesthesia Postprocedure Evaluation (Signed)
Anesthesia Post Note  Patient: Taylor Henry  Procedure(s) Performed: * No procedures listed *  Patient location during evaluation: Mother Baby Anesthesia Type: Epidural Level of consciousness: awake and alert Pain management: pain level controlled Vital Signs Assessment: post-procedure vital signs reviewed and stable Respiratory status: spontaneous breathing and nonlabored ventilation Cardiovascular status: stable Postop Assessment: no headache, no backache, patient able to bend at knees, no signs of nausea or vomiting, epidural receding and adequate PO intake Anesthetic complications: no     Last Vitals:  Vitals:   03/27/16 0130 03/27/16 0917  BP: 121/72 108/69  Pulse: 71 78  Resp: 18 17  Temp: 36.6 C 36.7 C    Last Pain:  Vitals:   03/27/16 0917  TempSrc: Axillary  PainSc: 0-No pain   Pain Goal: Patients Stated Pain Goal: 6 (03/26/16 1018)               Diogo Anne Hristova

## 2016-03-27 NOTE — Progress Notes (Signed)
S: No complaints. Feeling well. Lochia appropriate. No subjective fevers/chills.   O:  Vitals:   03/27/16 0130 03/27/16 0917  BP: 121/72 108/69  Pulse: 71 78  Resp: 18 17  Temp: 97.9 F (36.6 C) 98 F (36.7 C)    Gen: NAD, A&O Pulm: NWOB Abd: soft, appropriately ttp, fundus firm and below Umb Ext: No evidence of DVT, trace edema b/l  Labs CBC    Component Value Date/Time   WBC 11.6 (H) 03/27/2016 0543   RBC 3.36 (L) 03/27/2016 0543   HGB 9.2 (L) 03/27/2016 0543   HCT 27.7 (L) 03/27/2016 0543   PLT 158 03/27/2016 0543   MCV 82.4 03/27/2016 0543   MCH 27.4 03/27/2016 0543   MCHC 33.2 03/27/2016 0543   RDW 13.9 03/27/2016 0543     A/P:  PPD1 s/p SVD, doing well pp. AFVSS. Benign exam.  No sig med/surg issues.  Continue present care. Plan for d/c today per pt request. Baby boy to be circumcised by peds after hospital.   Belva AgeeElise Vanshika Jastrzebski MD

## 2016-03-27 NOTE — Discharge Summary (Signed)
Obstetric Discharge Summary Reason for Admission: induction of labor Prenatal Procedures: none Intrapartum Procedures: spontaneous vaginal delivery Postpartum Procedures: none Complications-Operative and Postpartum: none Hemoglobin  Date Value Ref Range Status  03/27/2016 9.2 (L) 12.0 - 15.0 g/dL Final   HCT  Date Value Ref Range Status  03/27/2016 27.7 (L) 36.0 - 46.0 % Final    Physical Exam:  General: alert and cooperative Lochia: appropriate Uterine Fundus: firm Incision: N/A DVT Evaluation: No evidence of DVT seen on physical exam. Negative Homan's sign. No cords or calf tenderness.  Discharge Diagnoses: Term Pregnancy-delivered  Discharge Information: Date: 03/27/2016 Activity: pelvic rest Diet: routine Medications: Ibuprofen, Colace and Iron Condition: stable Instructions: refer to practice specific booklet Discharge to: home   Newborn Data: Live born female  Birth Weight: 9 lb 10.3 oz (4374 g) APGAR: 9, 9  Home with mother.  [name] Jennifer Manuel Dall 03/27/2016, 10:43 AM

## 2016-03-27 NOTE — Lactation Note (Signed)
This note was copied from a baby's chart. Lactation Consultation Note  P2, BF first child for 3 months.  Had trouble w/ low milk supply but was supplementing w/ formula. Encouraged mother to exclusively breastfeed to establish her milk supply. Mother states baby is breastfeeding well.  Denies questions or concerns. Mom encouraged to feed baby 8-12 times/24 hours and with feeding cues.  Mom made aware of O/P services, breastfeeding support groups, community resources, and our phone # for post-discharge questions.  Provided mother w/ hand pump. Reviewed engorgement care and monitoring voids/stools.   Patient Name: Taylor Shanda BumpsJessica Brzoska Today's Date: 03/27/2016 Reason for consult: Initial assessment   Maternal Data Has patient been taught Hand Expression?: Yes Does the patient have breastfeeding experience prior to this delivery?: Yes  Feeding Length of feed: 15 min  LATCH Score/Interventions                      Lactation Tools Discussed/Used     Consult Status Consult Status: Complete    Hardie PulleyBerkelhammer, Sherronda Sweigert Boschen 03/27/2016, 4:18 PM

## 2016-03-28 ENCOUNTER — Inpatient Hospital Stay (HOSPITAL_COMMUNITY)
Admission: AD | Admit: 2016-03-28 | Payer: Medicaid Other | Source: Ambulatory Visit | Admitting: Obstetrics and Gynecology

## 2016-04-07 ENCOUNTER — Telehealth (HOSPITAL_COMMUNITY): Payer: Self-pay | Admitting: Lactation Services

## 2016-04-07 NOTE — Telephone Encounter (Signed)
Ms. Strough called to say that her newborn is a little over 281 week old and that he nurses for a long period of time (1.5hrs), feeding q2-3hrs. She has recently begun supplementing him w/2oz of formula and has not yet begun pumping b/c he is "on me constantly." Mom reports excellent output (5-6 BMs/day w/yellow stool, 5-6 wets/day).  Mom says her milk has "definitely" come to volume, but she did not make enough with her 1st child. Mom was inquiring about a "vitamin" to increase milk supply.  I encouraged Mom to come in for weighted feedings and observing infant at the breast. An appt was made for this Friday at 4pm.  Glenetta HewKim Vastie Douty, RN, [facility]

## 2016-04-07 NOTE — Telephone Encounter (Signed)
Patient called to ask if she could supplement her infant with regular Enfamil b/c she has a supply of it (she has been using Similac, but is running out). I told her that would be fine, but she could also call her pediatrician's office and ask.  From a lactation standpoint, I encouraged Mom to pump her breasts every time infant receives formula so we can protect her milk supply. In addition, I asked Mom to try putting Duwayne Hecksaiah back on the 1st breast (after offering the second breast) to improve her supply/see if he would feed more. I explained that breasts also make milk as an infant suckles, even if they feel soft. (Note from previous telephone conversation that infant has excellent output).   Glenetta HewKim Roni Friberg, RN, IBCLC

## 2016-04-09 ENCOUNTER — Ambulatory Visit (HOSPITAL_COMMUNITY)
Admission: RE | Admit: 2016-04-09 | Discharge: 2016-04-09 | Disposition: A | Discharge status: Home / Self Care | Payer: BLUE CROSS/BLUE SHIELD | Source: Ambulatory Visit | Attending: Obstetrics and Gynecology | Admitting: Obstetrics and Gynecology

## 2016-04-09 NOTE — Lactation Note (Signed)
Lactation Consult  Mother's reason for visit: per mom baby seems to feeding a lot and doesn't get satisfied and feedings are long  Visit Type:  Feeding assessment  Appointment Notes:  Baby is feeding 1 1/2 hours at a time . Milk is in , but have enough w / 1st baby - left message, and confirmed 9/15  Consult:  Initial Lactation Consultant:  Kathrin Greathouse  ________________________________________________________________________ Joan Flores Name:  Lezlie Lye Henson Date of Birth:  03/26/2017 Pediatrician:  Dr. Jenne Pane - [facility]  Gender:  female Gestational Age: [redacted]w[redacted]d (At Birth) Birth Weight:  9 lb 10.3 oz (4374 g) Weight at Discharge:  Weight: 9 lb 10.3 oz (4374 g) (Filed from Delivery Summary)                      Date of Discharge:  03/27/2016    Filed Weights   03/26/16 1750  Weight: 9 lb 10.3 oz (4374 g)  [facility] taken [facility]:  9/5 , up 4 oz form form the 3rd   Location:Pediatrician's office Weight today: 4412 g , 9-11.7 oz    Per mom F/U Dr. Visit with Dr. Jenne Pane - 10/3 or 10 /4 .  ________________________________________________________________________  Mother's Name: Shanda Bumps Thackston Type of delivery:  Vaginal Delivery  Breastfeeding Experience: 2nd baby ( per mom 1st baby after 2 months milk supply decreased  ( LC asked mom if she had returned to work, yes , but it had decreased before that, denies birth control , and stress )  Maternal Medical Conditions:  No risk - except with 1st baby MS ( explanation above )  Maternal Medications:  PNV   ________________________________________________________________________  Breastfeeding History (Post Discharge) - per mom milk came in at 3 days and no engorgement - just alittle sore when baby cluster feeding  Frequency of breastfeeding: every 2 - 3  hours - more often at nights , ( cluster feeds  Duration of feeding:  30 mins a side   Supplementing: if the baby does settled after feeding  both breast - EBM or formula ( Similiac)   Pumping: DEBP Ameda - 1-2 times a day between feedings 1 oz to 1 1/2 oz each breast   Infant Intake and Output Assessment  Voids:  > 6  in 24 hrs.  Color:  Clear yellow Stools:  > 2-3  in 24 hrs.  Color:  Brown and Yellow  ________________________________________________________________________  Maternal Breast Assessment  Breast:  Full Nipple:  Erect - semi compressible on the right ,  More compressible on the left  Pain level:  With latch when baby feeds a lot  Pain interventions:  Expressed breast milk  _______________________________________________________________________ Feeding Assessment/Evaluation  Initial feeding assessment: per mom last feeding at home 2:30 for 15 mins at the breast.   Infant's oral assessment:  Variance  - LC examined baby's oral cavity with gloved finger , noted high palate, short labial frenulum  With upper lip stretching well with exam , at the latch - upper lip needed to be flipped to flanged position on both latches and after breast wasn't has full  Easier for it to stay flanged. Also when abby was sucking on a gloved finger and note the back of the tongue humping.  Baby transferred an incredible amount at 76 weeks old 56 ml form the 1st breast , 68 ml off the 2nd breast . = 124 ml at 2 weeks in excellent.  See LC  impression below for further details  Positioning:  Cross cradle to cradle - depth achieved with assist  Left breast  LATCH documentation:  Latch:  2 = Grasps breast easily, tongue down, lips flanged, rhythmical sucking.  Audible swallowing:  2 = Spontaneous and intermittent  Type of nipple:  2 = Everted at rest and after stimulation  Comfort (Breast/Nipple):  1 = Filling, red/small blisters or bruises, mild/mod discomfort  Hold (Positioning):  1 = Assistance needed to correctly position infant at breast and maintain latch  LATCH score: 8   Attached assessment:  Shallow @ 1st , flipped upper  lip to flanged position  Lips flanged:  No. - flanged with assist   Lips untucked:  Yes.    Suck assessment:  Nutritive and Nonnutritive  Tools:  None needed   Pre-feed weight:  4412 g , 9-11.7 oz  Post-feed weight:  4468 g , 9-13.6 oz  Amount transferred:  56 ml  Amount supplemented: none needed   Additional Feeding Assessment -   Infant's oral assessment:  Variance see above note   Positioning:  Football Right breast  LATCH documentation:  Latch:  2 = Grasps breast easily, tongue down, lips flanged, rhythmical sucking.  Audible swallowing:  2 = Spontaneous and intermittent  Type of nipple:  2 = Everted at rest and after stimulation  Comfort (Breast/Nipple):  1 = Filling, red/small blisters or bruises, mild/mod discomfort  Hold (Positioning):  2 = No assistance needed to correctly position infant at breast  LATCH score:  9   Attached assessment:  Shallow @ 1st   Lips flanged:  No./ LC assisted to flip upper lip to flanged position   Lips untucked:  Yes.    Suck assessment:  Nutritive  Tools:  None needed   Pre-feed weight:  4468 g , 9-13.6 oz  Post-feed weight: 4536 g , 10.0 oz  Amount transferred: 68 ml  Amount supplemented: none needed   Total amount pumped post feed: not needed - both breast softened   Total amount transferred:  124 ml  ( excellent for 362 week old baby without spitting - tolerated well.  Per mom impressed.  Total supplement given:  None needed   Lactation Impression: See oral variance above [facility] - when breast aren't has full mom will probably beable to obtain the depth better And the short labial frenulum won't be an issue - but if over full difficult to obtain depth - resulting in baby feeding often. Mom aware.  Excellent milk supply for 642 weeks old - impressive  Mom expressed she was impressed and was so worried - due to her 1st baby .  @ latches a lot of review of basics - mom presented with fairly full breast  and lateral nodules and swollen ducts ( resolved with feedings. Baby was satisfied after feeding, still awake , calm- baby baby  Exploring with mom - possible her milk supply decrease at 2 months with her 1st baby was due to stress in her life at the time  ( she feels her life is much calmer now)  LC praised her for her efforts caring for her baby and breast feeding , and recommended to followup at the [facility] every week or every 2 weeks for support  And to due a pre and post weight. Also to call if she needs a Breast feeding pep talk or questions.  See LC plan below   Lactation Plan of Care :  Praised  mom for her efforts breast feeding - and keep up ! Continue to offer breast 2 - 2 1/2 hours days / evenings and not to go over 3 hours  Nights - Duwayne Heck will probably shift to very 3, and eventually every 4 if feeding more days / evenings Growth Spurts - cluster feeding is normal , 3 weeks , 6 weeks ( growing rapidly ) and beyond .  Skin to skin feedings until Isaiah can stay awake for feedings - watch for hanging out - non - nutritive  Google - Lactaton cookies  Alternate - at least 2 different positions ( ex football - cross cradle to cradle - enhance volume to baby  Protects milk supply, preventive against plugged ducts  And more flexible baby  After am feeding is breast are still full pump both breast 10 mins to release down - so next feeding baby will get to the  Fatty milk quicker and be more satisfied. And PRN .

## 2016-05-04 DIAGNOSIS — Z1389 Encounter for screening for other disorder: Secondary | ICD-10-CM | POA: Diagnosis not present

## 2016-06-10 NOTE — H&P (Addendum)
Taylor BumpsJessica Henry is an 30 y.o. female presents for sterilization.     Menstrual History:  Patient's last menstrual period was 06/22/2015.    Past Medical History:  Diagnosis Date  . HSV (herpes simplex virus) infection    ORAL    Past Surgical History:  Procedure Laterality Date  . ADENOIDECTOMY  1991  . TONSILLECTOMY      Family History  Problem Relation Age of Onset  . Hyperlipidemia Father   . Hypertension Father   . Hodgkin's lymphoma Mother   . Heart disease Paternal Grandmother   . Hypertension Paternal Grandmother   . Diabetes Neg Hx     Social History:  reports that she has never smoked. She has never used smokeless tobacco. She reports that she does not drink alcohol or use drugs.  Allergies:  Allergies  Allergen Reactions  . Codeine Nausea And Vomiting  . Penicillins Rash    Has patient had a PCN reaction causing immediate rash, facial/tongue/throat swelling, SOB or lightheadedness with hypotension: Yes Has patient had a PCN reaction causing severe rash involving mucus membranes or skin necrosis: Yes Has patient had a PCN reaction that required hospitalization No Has patient had a PCN reaction occurring within the last 10 years: No If all of the above answers are "NO", then may proceed with Cephalosporin use.    No prescriptions prior to admission.    ROS  Physical Exam  Gen - NAD CV - RRR Lungs - clear ABd - soft, NT Ext - NT, no edema PV - no tenderness or masses   Assessment/Plan: Desires sterilization L/s BTL with filsche clips R/b/a discussed, questions answered, informed consent  Taylor Henry 06/10/2016, 1:27 PM

## 2016-06-25 DIAGNOSIS — Z302 Encounter for sterilization: Secondary | ICD-10-CM | POA: Diagnosis not present

## 2016-06-25 DIAGNOSIS — Z113 Encounter for screening for infections with a predominantly sexual mode of transmission: Secondary | ICD-10-CM | POA: Diagnosis not present

## 2016-06-29 ENCOUNTER — Encounter (HOSPITAL_BASED_OUTPATIENT_CLINIC_OR_DEPARTMENT_OTHER): Payer: Self-pay | Admitting: *Deleted

## 2016-06-29 NOTE — Progress Notes (Signed)
Pt instructed npo pmn 12/7.  [facility]  Needs urine hcg, T&S, cbc, istat 8 on arrival

## 2016-07-01 NOTE — Anesthesia Preprocedure Evaluation (Signed)
Anesthesia Evaluation  Patient identified by MRN, date of birth, ID band Patient awake    Reviewed: Allergy & Precautions, NPO status , Patient's Chart, lab work & pertinent test results  Airway Mallampati: II  TM Distance: >3 FB Neck ROM: Full    Dental no notable dental hx.    Pulmonary neg pulmonary ROS,    Pulmonary exam normal breath sounds clear to auscultation       Cardiovascular negative cardio ROS Normal cardiovascular exam Rhythm:Regular Rate:Normal     Neuro/Psych negative neurological ROS  negative psych ROS   GI/Hepatic negative GI ROS, Neg liver ROS,   Endo/Other  negative endocrine ROS  Renal/GU negative Renal ROS     Musculoskeletal negative musculoskeletal ROS (+)   Abdominal   Peds  Hematology  (+) anemia ,   Anesthesia Other Findings   Reproductive/Obstetrics negative OB ROS                             Anesthesia Physical  Anesthesia Plan  ASA: I  Anesthesia Plan: General   Post-op Pain Management:    Induction: Intravenous  Airway Management Planned: Oral ETT  Additional Equipment:   Intra-op Plan:   Post-operative Plan: Extubation in OR  Informed Consent: I have reviewed the patients History and Physical, chart, labs and discussed the procedure including the risks, benefits and alternatives for the proposed anesthesia with the patient or authorized representative who has indicated his/her understanding and acceptance.   Dental advisory given  Plan Discussed with: CRNA  Anesthesia Plan Comments:         Anesthesia Quick Evaluation

## 2016-07-02 ENCOUNTER — Encounter (HOSPITAL_BASED_OUTPATIENT_CLINIC_OR_DEPARTMENT_OTHER): Payer: Self-pay | Admitting: *Deleted

## 2016-07-02 ENCOUNTER — Ambulatory Visit (HOSPITAL_BASED_OUTPATIENT_CLINIC_OR_DEPARTMENT_OTHER): Payer: BLUE CROSS/BLUE SHIELD | Admitting: Anesthesiology

## 2016-07-02 ENCOUNTER — Encounter (HOSPITAL_BASED_OUTPATIENT_CLINIC_OR_DEPARTMENT_OTHER)
Admission: RE | Disposition: A | Discharge status: Home / Self Care | Payer: Self-pay | Source: Ambulatory Visit | Attending: Obstetrics and Gynecology

## 2016-07-02 ENCOUNTER — Ambulatory Visit (HOSPITAL_BASED_OUTPATIENT_CLINIC_OR_DEPARTMENT_OTHER)
Admission: RE | Admit: 2016-07-02 | Discharge: 2016-07-02 | Disposition: A | Discharge status: Home / Self Care | Payer: BLUE CROSS/BLUE SHIELD | Source: Ambulatory Visit | Attending: Obstetrics and Gynecology | Admitting: Obstetrics and Gynecology

## 2016-07-02 DIAGNOSIS — Z79899 Other long term (current) drug therapy: Secondary | ICD-10-CM | POA: Insufficient documentation

## 2016-07-02 DIAGNOSIS — Z302 Encounter for sterilization: Secondary | ICD-10-CM | POA: Diagnosis not present

## 2016-07-02 DIAGNOSIS — D649 Anemia, unspecified: Secondary | ICD-10-CM | POA: Diagnosis not present

## 2016-07-02 HISTORY — DX: Hypoglycemia, unspecified: E16.2

## 2016-07-02 HISTORY — DX: Presence of spectacles and contact lenses: Z97.3

## 2016-07-02 HISTORY — DX: Acute upper respiratory infection, unspecified: J06.9

## 2016-07-02 HISTORY — PX: LAPAROSCOPIC TUBAL LIGATION: SHX1937

## 2016-07-02 HISTORY — DX: Anemia, unspecified: D64.9

## 2016-07-02 LAB — CBC
HEMATOCRIT: 34.2 % — AB (ref 36.0–46.0)
HEMOGLOBIN: 11.2 g/dL — AB (ref 12.0–15.0)
MCH: 26.9 pg (ref 26.0–34.0)
MCHC: 32.7 g/dL (ref 30.0–36.0)
MCV: 82 fL (ref 78.0–100.0)
Platelets: 282 10*3/uL (ref 150–400)
RBC: 4.17 MIL/uL (ref 3.87–5.11)
RDW: 13.7 % (ref 11.5–15.5)
WBC: 7.4 10*3/uL (ref 4.0–10.5)

## 2016-07-02 LAB — TYPE AND SCREEN
ABO/RH(D): O POS
Antibody Screen: NEGATIVE

## 2016-07-02 LAB — POCT PREGNANCY, URINE: Preg Test, Ur: NEGATIVE

## 2016-07-02 LAB — POCT I-STAT, CHEM 8
BUN: 14 mg/dL (ref 6–20)
CHLORIDE: 106 mmol/L (ref 101–111)
CREATININE: 0.6 mg/dL (ref 0.44–1.00)
Calcium, Ion: 1.24 mmol/L (ref 1.15–1.40)
Glucose, Bld: 86 mg/dL (ref 65–99)
HEMATOCRIT: 35 % — AB (ref 36.0–46.0)
Hemoglobin: 11.9 g/dL — ABNORMAL LOW (ref 12.0–15.0)
Potassium: 4 mmol/L (ref 3.5–5.1)
SODIUM: 140 mmol/L (ref 135–145)
TCO2: 23 mmol/L (ref 0–100)

## 2016-07-02 LAB — ABO/RH: ABO/RH(D): O POS

## 2016-07-02 SURGERY — LIGATION, FALLOPIAN TUBE, LAPAROSCOPIC
Anesthesia: General | Site: Abdomen | Laterality: Bilateral

## 2016-07-02 MED ORDER — PROMETHAZINE HCL 25 MG/ML IJ SOLN
6.2500 mg | INTRAMUSCULAR | Status: DC | PRN
  Administered 2016-07-02: 6.25 mg via INTRAVENOUS
  Filled 2016-07-02: qty 1

## 2016-07-02 MED ORDER — BUPIVACAINE HCL 0.25 % IJ SOLN
INTRAMUSCULAR | Status: DC | PRN
  Administered 2016-07-02: 7 mL

## 2016-07-02 MED ORDER — DEXAMETHASONE SODIUM PHOSPHATE 4 MG/ML IJ SOLN
INTRAMUSCULAR | Status: DC | PRN
  Administered 2016-07-02: 10 mg via INTRAVENOUS

## 2016-07-02 MED ORDER — PROPOFOL 10 MG/ML IV BOLUS
INTRAVENOUS | Status: AC
  Filled 2016-07-02: qty 40

## 2016-07-02 MED ORDER — ROCURONIUM BROMIDE 50 MG/5ML IV SOSY
PREFILLED_SYRINGE | INTRAVENOUS | Status: DC | PRN
  Administered 2016-07-02: 30 mg via INTRAVENOUS

## 2016-07-02 MED ORDER — OXYCODONE-ACETAMINOPHEN 5-325 MG PO TABS: 1.0000 | ORAL_TABLET | Freq: Four times a day (QID) | ORAL | 0 refills | Status: DC | PRN

## 2016-07-02 MED ORDER — SUGAMMADEX SODIUM 200 MG/2ML IV SOLN
INTRAVENOUS | Status: AC
  Filled 2016-07-02: qty 2

## 2016-07-02 MED ORDER — MIDAZOLAM HCL 2 MG/2ML IJ SOLN
INTRAMUSCULAR | Status: AC
  Filled 2016-07-02: qty 2

## 2016-07-02 MED ORDER — LIDOCAINE 2% (20 MG/ML) 5 ML SYRINGE
INTRAMUSCULAR | Status: DC | PRN
  Administered 2016-07-02: 60 mg via INTRAVENOUS

## 2016-07-02 MED ORDER — ONDANSETRON HCL 4 MG/2ML IJ SOLN
INTRAMUSCULAR | Status: DC | PRN
  Administered 2016-07-02: 4 mg via INTRAVENOUS

## 2016-07-02 MED ORDER — KETOROLAC TROMETHAMINE 30 MG/ML IJ SOLN
INTRAMUSCULAR | Status: AC
Start: 2016-07-02 — End: 2016-07-02
  Filled 2016-07-02: qty 1

## 2016-07-02 MED ORDER — FENTANYL CITRATE (PF) 100 MCG/2ML IJ SOLN
INTRAMUSCULAR | Status: AC
  Filled 2016-07-02: qty 2

## 2016-07-02 MED ORDER — HYDROMORPHONE HCL 1 MG/ML IJ SOLN
0.2500 mg | INTRAMUSCULAR | Status: DC | PRN
  Filled 2016-07-02: qty 0.5

## 2016-07-02 MED ORDER — OXYCODONE HCL 5 MG PO TABS
5.0000 mg | ORAL_TABLET | Freq: Once | ORAL | Status: DC | PRN
  Filled 2016-07-02: qty 1

## 2016-07-02 MED ORDER — ROCURONIUM BROMIDE 50 MG/5ML IV SOSY
PREFILLED_SYRINGE | INTRAVENOUS | Status: AC
  Filled 2016-07-02: qty 5

## 2016-07-02 MED ORDER — PROPOFOL 10 MG/ML IV BOLUS
INTRAVENOUS | Status: DC | PRN
  Administered 2016-07-02: 160 mg via INTRAVENOUS

## 2016-07-02 MED ORDER — KETOROLAC TROMETHAMINE 30 MG/ML IJ SOLN
30.0000 mg | Freq: Once | INTRAMUSCULAR | Status: DC | PRN
  Filled 2016-07-02: qty 1

## 2016-07-02 MED ORDER — FENTANYL CITRATE (PF) 100 MCG/2ML IJ SOLN
INTRAMUSCULAR | Status: DC | PRN
  Administered 2016-07-02: 50 ug via INTRAVENOUS
  Administered 2016-07-02: 100 ug via INTRAVENOUS

## 2016-07-02 MED ORDER — GENTAMICIN SULFATE 40 MG/ML IJ SOLN
INTRAVENOUS | Status: AC
  Administered 2016-07-02: 100 mL via INTRAVENOUS
  Filled 2016-07-02: qty 9.5

## 2016-07-02 MED ORDER — MIDAZOLAM HCL 5 MG/5ML IJ SOLN
INTRAMUSCULAR | Status: DC | PRN
  Administered 2016-07-02: 2 mg via INTRAVENOUS

## 2016-07-02 MED ORDER — DEXAMETHASONE SODIUM PHOSPHATE 10 MG/ML IJ SOLN
INTRAMUSCULAR | Status: AC
  Filled 2016-07-02: qty 1

## 2016-07-02 MED ORDER — KETOROLAC TROMETHAMINE 30 MG/ML IJ SOLN
INTRAMUSCULAR | Status: DC | PRN
  Administered 2016-07-02: 30 mg via INTRAVENOUS

## 2016-07-02 MED ORDER — PROMETHAZINE HCL 25 MG/ML IJ SOLN
INTRAMUSCULAR | Status: AC
  Filled 2016-07-02: qty 1

## 2016-07-02 MED ORDER — LIDOCAINE 2% (20 MG/ML) 5 ML SYRINGE
INTRAMUSCULAR | Status: AC
  Filled 2016-07-02: qty 5

## 2016-07-02 MED ORDER — LACTATED RINGERS IV SOLN
INTRAVENOUS | Status: DC
  Administered 2016-07-02 (×3): via INTRAVENOUS
  Filled 2016-07-02: qty 1000

## 2016-07-02 MED ORDER — MEPERIDINE HCL 25 MG/ML IJ SOLN
6.2500 mg | INTRAMUSCULAR | Status: DC | PRN
  Filled 2016-07-02: qty 1

## 2016-07-02 MED ORDER — ONDANSETRON HCL 4 MG/2ML IJ SOLN
INTRAMUSCULAR | Status: AC
  Filled 2016-07-02: qty 2

## 2016-07-02 MED ORDER — OXYCODONE HCL 5 MG/5ML PO SOLN
5.0000 mg | Freq: Once | ORAL | Status: DC | PRN
  Filled 2016-07-02: qty 5

## 2016-07-02 MED ORDER — SUGAMMADEX SODIUM 200 MG/2ML IV SOLN
INTRAVENOUS | Status: DC | PRN
  Administered 2016-07-02: 153.4 mg via INTRAVENOUS

## 2016-07-02 SURGICAL SUPPLY — 40 items
BENZOIN TINCTURE PRP APPL 2/3 (GAUZE/BANDAGES/DRESSINGS) IMPLANT
BLADE SURG 11 STRL SS (BLADE) ×2 IMPLANT
CANISTER SUCTION 2500CC (MISCELLANEOUS) IMPLANT
CATH FOLEY 2WAY SLVR  5CC 16FR (CATHETERS)
CATH FOLEY 2WAY SLVR 5CC 16FR (CATHETERS) IMPLANT
CATH ROBINSON RED A/P 16FR (CATHETERS) ×2 IMPLANT
CHLORAPREP W/TINT 26ML (MISCELLANEOUS) ×2 IMPLANT
CLIP FILSHIE TUBAL LIGA STRL (Clip) ×4 IMPLANT
COVER MAYO STAND STRL (DRAPES) ×2 IMPLANT
DRAPE UNDERBUTTOCKS STRL (DRAPE) ×2 IMPLANT
DRSG TEGADERM 4X4.75 (GAUZE/BANDAGES/DRESSINGS) ×2 IMPLANT
GLOVE BIO SURGEON STRL SZ 6.5 (GLOVE) ×4 IMPLANT
GLOVE INDICATOR 7.0 STRL GRN (GLOVE) ×2 IMPLANT
GOWN STRL REUS W/ TWL LRG LVL3 (GOWN DISPOSABLE) ×2 IMPLANT
GOWN STRL REUS W/TWL LRG LVL3 (GOWN DISPOSABLE) ×2
KIT ROOM TURNOVER WOR (KITS) ×2 IMPLANT
LEGGING LITHOTOMY PAIR STRL (DRAPES) ×2 IMPLANT
LIQUID BAND (GAUZE/BANDAGES/DRESSINGS) ×2 IMPLANT
NEEDLE HYPO 25X1 1.5 SAFETY (NEEDLE) ×2 IMPLANT
NS IRRIG 500ML POUR BTL (IV SOLUTION) ×2 IMPLANT
PACK BASIN DAY SURGERY FS (CUSTOM PROCEDURE TRAY) ×2 IMPLANT
PACK LAPAROSCOPY II (CUSTOM PROCEDURE TRAY) ×2 IMPLANT
PAD OB MATERNITY 4.3X12.25 (PERSONAL CARE ITEMS) ×2 IMPLANT
PAD PREP 24X48 CUFFED NSTRL (MISCELLANEOUS) ×2 IMPLANT
PADDING ION DISPOSABLE (MISCELLANEOUS) ×2 IMPLANT
POUCH SPECIMEN RETRIEVAL 10MM (ENDOMECHANICALS) IMPLANT
SET IRRIG TUBING LAPAROSCOPIC (IRRIGATION / IRRIGATOR) IMPLANT
SOLUTION ANTI FOG 6CC (MISCELLANEOUS) ×2 IMPLANT
SPONGE GAUZE 2X2 8PLY STRL LF (GAUZE/BANDAGES/DRESSINGS) ×2 IMPLANT
SUT VIC AB 3-0 PS2 18 (SUTURE) ×1
SUT VIC AB 3-0 PS2 18XBRD (SUTURE) ×1 IMPLANT
SUT VICRYL 0 UR6 27IN ABS (SUTURE) ×2 IMPLANT
SYR 3ML 23GX1 SAFETY (SYRINGE) IMPLANT
SYR CONTROL 10ML LL (SYRINGE) ×2 IMPLANT
SYRINGE 10CC LL (SYRINGE) ×2 IMPLANT
TOWEL OR 17X24 6PK STRL BLUE (TOWEL DISPOSABLE) ×2 IMPLANT
TRAY DSU PREP LF (CUSTOM PROCEDURE TRAY) ×2 IMPLANT
TROCAR OPTI TIP 5M 100M (ENDOMECHANICALS) IMPLANT
TROCAR XCEL NON-BLD 11X100MML (ENDOMECHANICALS) ×2 IMPLANT
TUBING INSUFFLATION 10FT LAP (TUBING) ×2 IMPLANT

## 2016-07-02 NOTE — Transfer of Care (Signed)
Immediate Anesthesia Transfer of Care Note  Patient: Taylor Henry  Procedure(s) Performed: Procedure(s): LAPAROSCOPIC TUBAL LIGATION with filshie clips (Bilateral)  Patient Location: PACU  Anesthesia Type:General  Level of Consciousness: sedated and responds to stimulation  Airway & Oxygen Therapy: Patient Spontanous Breathing and Patient connected to nasal cannula oxygen  Post-op Assessment: Report given to RN  Post vital signs: Reviewed and stable  Last Vitals: 110/62, 122, 22, 99%, 97.1  Vitals:   07/02/16 0600  BP: 117/64  Pulse: 88  Resp: 14  Temp: 36.3 C    Last Pain:  Vitals:   07/02/16 0600  TempSrc: Oral      Patients Stated Pain Goal: 9 (07/02/16 0659)  Complications: No apparent anesthesia complications

## 2016-07-02 NOTE — Discharge Instructions (Signed)
HOME CARE INSTRUCTIONS - LAPAROSCOPY  Wound Care: The bandaids or dressing which are placed over the skin openings may be removed the day after surgery. The incision should be kept clean and dry. The stitches do not need to be removed. Should the incision become sore, red, and swollen after the first week, check with your doctor.  Personal Hygiene: Shower the day after your procedure. Always wipe from front to back after elimination.   Activity: Do not drive or operate any equipment today. The effects of the anesthesia are still present and drowsiness may result. Rest today, not necessarily flat bed rest, just take it easy. You may resume your normal activity in one to three days or as instructed by your physician.  Sexual Activity: You resume sexual activity as indicated by your physician_________. If your laparoscopy was for a sterilization ( tubes tied ), continue current method of birth control until after your next period or ask for specific instructions from your doctor.  Diet: Eat a light diet as desired this evening. You may resume a regular diet tomorrow.  Return to Work: Two to three days or as indicated by your doctor.  Expectations After Surgery: Your surgery will cause vaginal drainage or spotting which may continue for 2-3 days. Mild abdominal discomfort or tenderness is not unusual and some shoulder pain may also be noted which can be relieved by lying flat in pain.  Call Your Doctor If these Occur:  Persistent or heavy bleeding at incision site       Redness or swelling around incision       Elevation of temperature greater than 100 degrees F  Call for follow-up appointment _____________. Post Anesthesia Home Care Instructions  Activity: Get plenty of rest for the remainder of the day. A responsible adult should stay with you for 24 hours following the procedure.  For the next 24 hours, DO NOT: -Drive a car -Advertising copywriterperate machinery -Drink alcoholic beverages -Take any  medication unless instructed by your physician -Make any legal decisions or sign important papers.  Meals: Start with liquid foods such as gelatin or soup. Progress to regular foods as tolerated. Avoid greasy, spicy, heavy foods. If nausea and/or vomiting occur, drink only clear liquids until the nausea and/or vomiting subsides. Call your physician if vomiting continues.  Special Instructions/Symptoms: Your throat may feel dry or sore from the anesthesia or the breathing tube placed in your throat during surgery. If this causes discomfort, gargle with warm salt water. The discomfort should disappear within 24 hours.  If you had a scopolamine patch placed behind your ear for the management of post- operative nausea and/or vomiting:  1. The medication in the patch is effective for 72 hours, after which it should be removed.  Wrap patch in a tissue and discard in the trash. Wash hands thoroughly with soap and water. 2. You may remove the patch earlier than 72 hours if you experience unpleasant side effects which may include dry mouth, dizziness or visual disturbances. 3. Avoid touching the patch. Wash your hands with soap and water after contact with the patch.    Post Anesthesia Home Care Instructions  Activity: Get plenty of rest for the remainder of the day. A responsible adult should stay with you for 24 hours following the procedure.  For the next 24 hours, DO NOT: -Drive a car -Advertising copywriterperate machinery -Drink alcoholic beverages -Take any medication unless instructed by your physician -Make any legal decisions or sign important papers.  Meals: Start with  liquid foods such as gelatin or soup. Progress to regular foods as tolerated. Avoid greasy, spicy, heavy foods. If nausea and/or vomiting occur, drink only clear liquids until the nausea and/or vomiting subsides. Call your physician if vomiting continues.  Special Instructions/Symptoms: Your throat may feel dry or sore from the anesthesia  or the breathing tube placed in your throat during surgery. If this causes discomfort, gargle with warm salt water. The discomfort should disappear within 24 hours.  If you had a scopolamine patch placed behind your ear for the management of post- operative nausea and/or vomiting:  1. The medication in the patch is effective for 72 hours, after which it should be removed.  Wrap patch in a tissue and discard in the trash. Wash hands thoroughly with soap and water. 2. You may remove the patch earlier than 72 hours if you experience unpleasant side effects which may include dry mouth, dizziness or visual disturbances. 3. Avoid touching the patch. Wash your hands with soap and water after contact with the patch.

## 2016-07-02 NOTE — Anesthesia Postprocedure Evaluation (Signed)
Anesthesia Post Note  Patient: Taylor Henry  Procedure(s) Performed: Procedure(s) (LRB): LAPAROSCOPIC TUBAL LIGATION with filshie clips (Bilateral)  Patient location during evaluation: PACU Anesthesia Type: General Level of consciousness: sedated and patient cooperative Pain management: pain level controlled Vital Signs Assessment: post-procedure vital signs reviewed and stable Respiratory status: spontaneous breathing Cardiovascular status: stable Anesthetic complications: no    Last Vitals:  Vitals:   07/02/16 0930 07/02/16 0945  BP: 122/73 121/77  Pulse: 87 91  Resp: 14 10  Temp:      Last Pain:  Vitals:   07/02/16 1000  TempSrc:   PainSc: 5                  Lewie LoronJohn Zoya Sprecher

## 2016-07-02 NOTE — Anesthesia Procedure Notes (Signed)
Procedure Name: Intubation Date/Time: 07/02/2016 7:39 AM Performed by: Maris BergerENENNY, Esta Carmon T Pre-anesthesia Checklist: Patient identified, Emergency Drugs available, Suction available and Patient being monitored Patient Re-evaluated:Patient Re-evaluated prior to inductionOxygen Delivery Method: Circle system utilized Preoxygenation: Pre-oxygenation with 100% oxygen Intubation Type: IV induction Ventilation: Mask ventilation without difficulty Laryngoscope Size: Mac and 3 Grade View: Grade II Tube type: Oral Tube size: 7.0 mm Number of attempts: 2 Airway Equipment and Method: Stylet Placement Confirmation: ETT inserted through vocal cords under direct vision,  positive ETCO2 and breath sounds checked- equal and bilateral Secured at: 20 cm Tube secured with: Tape Dental Injury: Teeth and Oropharynx as per pre-operative assessment  Comments: First attempt, pt still tight, removed blade and ventilated, 2nd attempt easy intubation.

## 2016-07-05 ENCOUNTER — Encounter (HOSPITAL_BASED_OUTPATIENT_CLINIC_OR_DEPARTMENT_OTHER): Payer: Self-pay | Admitting: Obstetrics and Gynecology

## 2016-07-05 NOTE — Op Note (Signed)
NAME:  Taylor Henry, Taylor Henry                  ACCOUNT NO.:  MEDICAL RECORD NO.:  [id number]  LOCATION:                                 FACILITY:  PHYSICIAN:  Zelphia CairoGretchen Noya Santarelli, MD    DATE OF BIRTH:  May 08, 1986  DATE OF PROCEDURE:  07/02/2016 DATE OF DISCHARGE:                              OPERATIVE REPORT   PREOPERATIVE DIAGNOSIS:  Desires permanent sterilization.  POSTOPERATIVE DIAGNOSIS:  Desires permanent sterilization.  PROCEDURE:  Laparoscopic bilateral tubal ligation with Filshie clips.  SURGEON:  Zelphia CairoGretchen Sylvanus Telford, MD.  ANESTHESIA:  General.  COMPLICATIONS:  None.  SPECIMENS:  None.  PROCEDURE:  The patient was taken to the operating room after informed consent was obtained.  She was given general anesthesia, placed in the dorsal lithotomy position using Allen stirrups, prepped and draped sterilely, and an in and out catheter was used to drain her bladder. Bivalve speculum was placed in the vagina, and a Hulka clamp was placed on the cervix.  Our attention was then turned to the abdomen.  0.25% Marcaine was used to provide local anesthesia.  An infraumbilical skin incision was made with a scalpel.  This was extended bluntly to the fascia using a Kelly.  Optical trocar was then inserted under direct visualization.  Once intraperitoneal placement was confirmed, CO2 was turned on, and a survey was performed.  Uterus, bilateral tubes, and ovaries appeared normal.  A Filshie clip was placed on each fallopian tube.  Proper placement was confirmed, and all instruments were removed from the abdomen.  A deep stitch was placed in the umbilical incision. The skin was closed with Vicryl.  Skin glue was applied.  Hulka clamp was removed.  She was extubated and taken to the recovery room in stable condition.     Zelphia CairoGretchen Kristal Perl, MD     GA/MEDQ  D:  07/02/2016  T:  07/03/2016  Job:  [id number]

## 2016-07-14 DIAGNOSIS — D649 Anemia, unspecified: Secondary | ICD-10-CM | POA: Diagnosis not present

## 2016-08-24 DIAGNOSIS — R509 Fever, unspecified: Secondary | ICD-10-CM | POA: Diagnosis not present

## 2016-08-24 DIAGNOSIS — M791 Myalgia: Secondary | ICD-10-CM | POA: Diagnosis not present

## 2016-08-24 DIAGNOSIS — J988 Other specified respiratory disorders: Secondary | ICD-10-CM | POA: Diagnosis not present

## 2016-08-25 DIAGNOSIS — Z6827 Body mass index (BMI) 27.0-27.9, adult: Secondary | ICD-10-CM | POA: Diagnosis not present

## 2016-08-25 DIAGNOSIS — J101 Influenza due to other identified influenza virus with other respiratory manifestations: Secondary | ICD-10-CM | POA: Diagnosis not present

## 2016-11-04 DIAGNOSIS — R350 Frequency of micturition: Secondary | ICD-10-CM | POA: Diagnosis not present

## 2016-11-04 DIAGNOSIS — R42 Dizziness and giddiness: Secondary | ICD-10-CM | POA: Diagnosis not present

## 2016-11-22 DIAGNOSIS — Z6829 Body mass index (BMI) 29.0-29.9, adult: Secondary | ICD-10-CM | POA: Diagnosis not present

## 2016-11-22 DIAGNOSIS — E663 Overweight: Secondary | ICD-10-CM | POA: Diagnosis not present

## 2016-11-22 DIAGNOSIS — N390 Urinary tract infection, site not specified: Secondary | ICD-10-CM | POA: Diagnosis not present

## 2016-12-03 DIAGNOSIS — Z6828 Body mass index (BMI) 28.0-28.9, adult: Secondary | ICD-10-CM | POA: Diagnosis not present

## 2016-12-03 DIAGNOSIS — E663 Overweight: Secondary | ICD-10-CM | POA: Diagnosis not present

## 2016-12-03 DIAGNOSIS — N390 Urinary tract infection, site not specified: Secondary | ICD-10-CM | POA: Diagnosis not present

## 2017-01-27 DIAGNOSIS — R3129 Other microscopic hematuria: Secondary | ICD-10-CM | POA: Diagnosis not present

## 2017-02-11 DIAGNOSIS — R3129 Other microscopic hematuria: Secondary | ICD-10-CM | POA: Diagnosis not present

## 2017-02-18 DIAGNOSIS — Z6829 Body mass index (BMI) 29.0-29.9, adult: Secondary | ICD-10-CM | POA: Diagnosis not present

## 2017-02-18 DIAGNOSIS — N390 Urinary tract infection, site not specified: Secondary | ICD-10-CM | POA: Diagnosis not present

## 2017-02-22 DIAGNOSIS — Z6829 Body mass index (BMI) 29.0-29.9, adult: Secondary | ICD-10-CM | POA: Diagnosis not present

## 2017-02-22 DIAGNOSIS — F419 Anxiety disorder, unspecified: Secondary | ICD-10-CM | POA: Diagnosis not present

## 2017-02-25 ENCOUNTER — Encounter: Payer: Self-pay | Admitting: *Deleted

## 2017-02-25 ENCOUNTER — Other Ambulatory Visit: Payer: Self-pay

## 2017-02-25 ENCOUNTER — Emergency Department
Admission: EM | Admit: 2017-02-25 | Discharge: 2017-02-25 | Disposition: A | Discharge status: Home / Self Care | Payer: BLUE CROSS/BLUE SHIELD | Attending: Emergency Medicine | Admitting: Emergency Medicine

## 2017-02-25 DIAGNOSIS — F419 Anxiety disorder, unspecified: Secondary | ICD-10-CM

## 2017-02-25 DIAGNOSIS — Z79899 Other long term (current) drug therapy: Secondary | ICD-10-CM | POA: Insufficient documentation

## 2017-02-25 DIAGNOSIS — T43215A Adverse effect of selective serotonin and norepinephrine reuptake inhibitors, initial encounter: Secondary | ICD-10-CM | POA: Diagnosis not present

## 2017-02-25 LAB — COMPREHENSIVE METABOLIC PANEL
ALBUMIN: 4.2 g/dL (ref 3.5–5.0)
ALK PHOS: 59 U/L (ref 38–126)
ALT: 13 U/L — ABNORMAL LOW (ref 14–54)
ANION GAP: 7 (ref 5–15)
AST: 20 U/L (ref 15–41)
BILIRUBIN TOTAL: 0.4 mg/dL (ref 0.3–1.2)
BUN: 15 mg/dL (ref 6–20)
CALCIUM: 9.2 mg/dL (ref 8.9–10.3)
CO2: 26 mmol/L (ref 22–32)
Chloride: 105 mmol/L (ref 101–111)
Creatinine, Ser: 0.99 mg/dL (ref 0.44–1.00)
Glucose, Bld: 102 mg/dL — ABNORMAL HIGH (ref 65–99)
POTASSIUM: 3.8 mmol/L (ref 3.5–5.1)
Sodium: 138 mmol/L (ref 135–145)
TOTAL PROTEIN: 7.9 g/dL (ref 6.5–8.1)

## 2017-02-25 LAB — URINALYSIS, COMPLETE (UACMP) WITH MICROSCOPIC
Bilirubin Urine: NEGATIVE
GLUCOSE, UA: NEGATIVE mg/dL
Ketones, ur: NEGATIVE mg/dL
Leukocytes, UA: NEGATIVE
NITRITE: NEGATIVE
PH: 7 (ref 5.0–8.0)
Protein, ur: NEGATIVE mg/dL
SQUAMOUS EPITHELIAL / LPF: NONE SEEN
Specific Gravity, Urine: 1.002 — ABNORMAL LOW (ref 1.005–1.030)

## 2017-02-25 LAB — CBC
HEMATOCRIT: 35.7 % (ref 35.0–47.0)
Hemoglobin: 12.1 g/dL (ref 12.0–16.0)
MCH: 28.2 pg (ref 26.0–34.0)
MCHC: 34 g/dL (ref 32.0–36.0)
MCV: 82.8 fL (ref 80.0–100.0)
Platelets: 244 10*3/uL (ref 150–440)
RBC: 4.31 MIL/uL (ref 3.80–5.20)
RDW: 16 % — AB (ref 11.5–14.5)
WBC: 8.2 10*3/uL (ref 3.6–11.0)

## 2017-02-25 MED ORDER — HYDROXYZINE HCL 10 MG PO TABS: 10.0000 mg | ORAL_TABLET | Freq: Three times a day (TID) | ORAL | 0 refills | Status: DC | PRN

## 2017-02-25 MED ORDER — HYDROXYZINE HCL 25 MG PO TABS
25.0000 mg | ORAL_TABLET | Freq: Once | ORAL | Status: AC
  Administered 2017-02-25: 25 mg via ORAL
  Filled 2017-02-25: qty 1

## 2017-02-25 NOTE — ED Triage Notes (Signed)
Pt reports she was prescribed lexapro and took it for the first time today and since taking it has been feeling like her heart is racing. Per ems pts HR never above 90.

## 2017-02-25 NOTE — ED Provider Notes (Signed)
ED ECG REPORT I, Arelia LongestSchaevitz,  Demontae Antunes M, the attending physician, personally viewed and interpreted this ECG.   Date: 02/25/2017  EKG Time: 1459  Rate: 83  Rhythm: normal sinus rhythm  Axis: Normal  Intervals:none  ST&T Change: No ST segment elevation or depression. No abnormal T-wave inversion.    Myrna BlazerSchaevitz, Theoplis Garciagarcia Matthew, MD 02/25/17 [phone]

## 2017-02-25 NOTE — ED Provider Notes (Signed)
[facility], [facility] Emergency Department Provider Note  ____________________________________________  Time seen: Approximately 2:34 PM  I have reviewed the triage vital signs and the nursing notes.   HISTORY  Chief Complaint Medication Reaction    HPI Taylor Henry is a 31 y.o. female presents to the emergency department after feeling like her heart was racing after takingLexapro for the first time today. She states that she took Lexapro at 1611 AM and started feeling like her heart was racing at 12 PM. After she started feeling like her heart was racing, she got anxious and her heart raced faster. She states that she was put on Lexapro by her primary care doctor because she has been feeling "foggy" for the last 2 months. She gets this feeling when walking through the aisles of the grocery store. She states that she also has urinary tract infections frequently but does not have any symptoms of this currently. She is currently on her menstrual cycle. She is wondering if she is diabetic. Her husband left her last year and she has 2 small children.  Her mother used to have panic attacks. She denies headache, shortness of breath, nausea, vomiting, abdominal pain.   Past Medical History:  Diagnosis Date  . Anemia    hx of anemia  . HSV (herpes simplex virus) infection    ORAL  . Hypoglycemia   . Normal vaginal delivery 02/2016  . Recent URI   . Wears glasses     Patient Active Problem List   Diagnosis Date Noted  . Pregnancy 03/26/2016    Past Surgical History:  Procedure Laterality Date  . ADENOIDECTOMY  1991  . LAPAROSCOPIC TUBAL LIGATION Bilateral 07/02/2016   Procedure: LAPAROSCOPIC TUBAL LIGATION with filshie clips;  Surgeon: Zelphia CairoGretchen Adkins, MD;  [facility] [place];  Service: Gynecology;  Laterality: Bilateral;  . TONSILLECTOMY      Prior to Admission medications   Medication Sig Start Date End Date Taking? Authorizing Provider  docusate  sodium (COLACE) 100 MG capsule Take 1 capsule (100 mg total) by mouth 2 (two) times daily. 03/27/16   Ranae PilaLeger, Elise Jennifer, MD  ferrous sulfate 325 (65 FE) MG EC tablet Take 1 tablet (325 mg total) by mouth 2 (two) times daily. 03/27/16   Ranae PilaLeger, Elise Jennifer, MD  hydrOXYzine (ATARAX/VISTARIL) 10 MG tablet Take 1 tablet (10 mg total) by mouth 3 (three) times daily as needed. 02/25/17   Enid DerryWagner, Kaedance Magos, PA-C  ibuprofen (ADVIL,MOTRIN) 600 MG tablet Take 1 tablet (600 mg total) by mouth every 6 (six) hours as needed. 03/27/16   Ranae PilaLeger, Elise Jennifer, MD  oxyCODONE-acetaminophen (ROXICET) 5-325 MG tablet Take 1-2 tablets by mouth every 6 (six) hours as needed for severe pain. 07/02/16   Zelphia CairoAdkins, Gretchen, MD    Allergies Codeine and Penicillins  Family History  Problem Relation Age of Onset  . Hyperlipidemia Father   . Hypertension Father   . Hodgkin's lymphoma Mother   . Heart disease Paternal Grandmother   . Hypertension Paternal Grandmother   . Diabetes Neg Hx     Social History Social History  Substance Use Topics  . Smoking status: Never Smoker  . Smokeless tobacco: Never Used  . Alcohol use No     Review of Systems  Constitutional: No fever/chills Respiratory:  No SOB. Gastrointestinal: No abdominal pain.  No nausea, no vomiting.  Genitourinary: Negative for dysuria. Musculoskeletal: Negative for musculoskeletal pain. Skin: Negative for rash, abrasions, lacerations, ecchymosis. Neurological: Negative for headaches, numbness or tingling  ____________________________________________   PHYSICAL EXAM:  VITAL SIGNS: ED Triage Vitals [02/25/17 1417]  Enc Vitals Group     BP 120/78     Pulse Rate 83     Resp 16     Temp 98 F (36.7 C)     Temp Source Oral     SpO2 100 %     Weight 180 lb (81.6 kg)     Height 5\' 5"  (1.651 m)     Head Circumference      Peak Flow      Pain Score      Pain Loc      Pain Edu?      Excl. in GC?      Eyes: Conjunctivae are normal. PERRL.  EOMI. Head: Atraumatic. ENT:      Ears:      Nose: No congestion/rhinnorhea.      Mouth/Throat: Mucous membranes are moist.  Neck: No stridor.   Cardiovascular: Normal rate, regular rhythm.  Good peripheral circulation. Respiratory: Normal respiratory effort without tachypnea or retractions. Lungs CTAB. Good air entry to the bases with no decreased or absent breath sounds. Gastrointestinal: Bowel sounds 4 quadrants. Soft and nontender to palpation. No guarding or rigidity. No palpable masses. No distention.  Musculoskeletal: Full range of motion to all extremities. No gross deformities appreciated. Neurologic:  Normal speech and language. No gross focal neurologic deficits are appreciated.  Skin:  Skin is warm, dry and intact. No rash noted.   ____________________________________________   LABS (all labs ordered are listed, but only abnormal results are displayed)  Labs Reviewed  CBC - Abnormal; Notable for the following:       Result Value   RDW 16.0 (*)    All other components within normal limits  COMPREHENSIVE METABOLIC PANEL - Abnormal; Notable for the following:    Glucose, Bld 102 (*)    ALT 13 (*)    All other components within normal limits  URINALYSIS, COMPLETE (UACMP) WITH MICROSCOPIC - Abnormal; Notable for the following:    Color, Urine COLORLESS (*)    APPearance CLEAR (*)    Specific Gravity, Urine 1.002 (*)    Hgb urine dipstick MODERATE (*)    Bacteria, UA RARE (*)    All other components within normal limits   ____________________________________________  EKG   ____________________________________________  RADIOLOGY  No results found.  ____________________________________________    PROCEDURES  Procedure(s) performed:    Procedures    Medications  hydrOXYzine (ATARAX/VISTARIL) tablet 25 mg (25 mg Oral Given 02/25/17 1459)     ____________________________________________   INITIAL IMPRESSION / ASSESSMENT AND PLAN / ED  COURSE  Pertinent labs & imaging results that were available during my care of the patient were reviewed by me and considered in my medical decision making (see chart for details).  Review of the [place] CSRS was performed in accordance of the [facility] prior to dispensing any controlled drugs.  Patient presented to the emergency department for the feeling of her heart was racing after taking her first dose of Lexapro. Vital signs and exam are reassuring. No changes on EKG. No infection on urinalysis. Symptoms are consistent with anxiety. Patient seems anxious in the room, and she felt better after hydroxyzine. Patient states that she felt anxious due to starting Lexapro and the effects would have. I do not think that this is a side effect of Lexapro and rather patient nervous about starting it. She is going to discuss a different SSRI with her PCP. Patient  will be discharged home with prescriptions for a short course of hydroxyzine. Patient is to follow up with PCP as directed. Patient is given ED precautions to return to the ED for any worsening or new symptoms.     ____________________________________________  FINAL CLINICAL IMPRESSION(S) / ED DIAGNOSES  Final diagnoses:  Anxiety      NEW MEDICATIONS STARTED DURING THIS VISIT:  New Prescriptions   HYDROXYZINE (ATARAX/VISTARIL) 10 MG TABLET    Take 1 tablet (10 mg total) by mouth 3 (three) times daily as needed.        This chart was dictated using voice recognition software/Dragon. Despite best efforts to proofread, errors can occur which can change the meaning. Any change was purely unintentional.    Enid Derry, PA-C 02/25/17 1552    Don Perking, [place], MD 02/25/17 [phone]

## 2017-03-24 DIAGNOSIS — R3129 Other microscopic hematuria: Secondary | ICD-10-CM | POA: Diagnosis not present

## 2017-04-04 DIAGNOSIS — Z6823 Body mass index (BMI) 23.0-23.9, adult: Secondary | ICD-10-CM | POA: Diagnosis not present

## 2017-04-04 DIAGNOSIS — R002 Palpitations: Secondary | ICD-10-CM | POA: Diagnosis not present

## 2017-04-04 DIAGNOSIS — F419 Anxiety disorder, unspecified: Secondary | ICD-10-CM | POA: Diagnosis not present

## 2017-04-04 DIAGNOSIS — Z1389 Encounter for screening for other disorder: Secondary | ICD-10-CM | POA: Diagnosis not present

## 2017-05-05 DIAGNOSIS — Z6828 Body mass index (BMI) 28.0-28.9, adult: Secondary | ICD-10-CM | POA: Diagnosis not present

## 2017-05-05 DIAGNOSIS — Z01419 Encounter for gynecological examination (general) (routine) without abnormal findings: Secondary | ICD-10-CM | POA: Diagnosis not present

## 2017-05-05 DIAGNOSIS — Z803 Family history of malignant neoplasm of breast: Secondary | ICD-10-CM | POA: Diagnosis not present

## 2017-05-12 DIAGNOSIS — K600 Acute anal fissure: Secondary | ICD-10-CM | POA: Diagnosis not present

## 2017-05-12 DIAGNOSIS — K625 Hemorrhage of anus and rectum: Secondary | ICD-10-CM | POA: Diagnosis not present

## 2018-02-06 DIAGNOSIS — Z6827 Body mass index (BMI) 27.0-27.9, adult: Secondary | ICD-10-CM | POA: Diagnosis not present

## 2018-02-06 DIAGNOSIS — F419 Anxiety disorder, unspecified: Secondary | ICD-10-CM | POA: Diagnosis not present

## 2018-03-09 DIAGNOSIS — F419 Anxiety disorder, unspecified: Secondary | ICD-10-CM | POA: Diagnosis not present

## 2018-03-09 DIAGNOSIS — R002 Palpitations: Secondary | ICD-10-CM | POA: Diagnosis not present

## 2018-03-09 DIAGNOSIS — Z6825 Body mass index (BMI) 25.0-25.9, adult: Secondary | ICD-10-CM | POA: Diagnosis not present

## 2018-03-09 DIAGNOSIS — R000 Tachycardia, unspecified: Secondary | ICD-10-CM | POA: Diagnosis not present

## 2018-09-07 DIAGNOSIS — Z23 Encounter for immunization: Secondary | ICD-10-CM | POA: Diagnosis not present

## 2018-09-07 DIAGNOSIS — Z113 Encounter for screening for infections with a predominantly sexual mode of transmission: Secondary | ICD-10-CM | POA: Diagnosis not present

## 2018-09-07 DIAGNOSIS — Z01419 Encounter for gynecological examination (general) (routine) without abnormal findings: Secondary | ICD-10-CM | POA: Diagnosis not present

## 2018-09-07 DIAGNOSIS — Z6823 Body mass index (BMI) 23.0-23.9, adult: Secondary | ICD-10-CM | POA: Diagnosis not present

## 2018-11-10 DIAGNOSIS — Z1231 Encounter for screening mammogram for malignant neoplasm of breast: Secondary | ICD-10-CM | POA: Diagnosis not present

## 2018-11-13 ENCOUNTER — Other Ambulatory Visit: Payer: Self-pay | Admitting: Obstetrics and Gynecology

## 2018-11-13 DIAGNOSIS — R928 Other abnormal and inconclusive findings on diagnostic imaging of breast: Secondary | ICD-10-CM

## 2018-11-21 ENCOUNTER — Other Ambulatory Visit: Payer: Self-pay

## 2018-11-21 ENCOUNTER — Other Ambulatory Visit: Payer: Self-pay | Admitting: Obstetrics and Gynecology

## 2018-11-21 ENCOUNTER — Ambulatory Visit
Admission: RE | Admit: 2018-11-21 | Discharge: 2018-11-21 | Disposition: A | Discharge status: Home / Self Care | Payer: BLUE CROSS/BLUE SHIELD | Source: Ambulatory Visit | Attending: Obstetrics and Gynecology | Admitting: Obstetrics and Gynecology

## 2018-11-21 DIAGNOSIS — R928 Other abnormal and inconclusive findings on diagnostic imaging of breast: Secondary | ICD-10-CM

## 2018-11-21 DIAGNOSIS — N6310 Unspecified lump in the right breast, unspecified quadrant: Secondary | ICD-10-CM | POA: Diagnosis not present

## 2018-12-19 DIAGNOSIS — I491 Atrial premature depolarization: Secondary | ICD-10-CM | POA: Diagnosis not present

## 2018-12-19 DIAGNOSIS — F419 Anxiety disorder, unspecified: Secondary | ICD-10-CM | POA: Diagnosis not present

## 2018-12-19 DIAGNOSIS — Z6823 Body mass index (BMI) 23.0-23.9, adult: Secondary | ICD-10-CM | POA: Diagnosis not present

## 2019-05-18 ENCOUNTER — Other Ambulatory Visit: Payer: Self-pay

## 2019-05-18 DIAGNOSIS — Z20822 Contact with and (suspected) exposure to covid-19: Secondary | ICD-10-CM

## 2019-05-20 LAB — NOVEL CORONAVIRUS, NAA: SARS-CoV-2, NAA: NOT DETECTED

## 2019-05-24 ENCOUNTER — Ambulatory Visit
Admission: RE | Admit: 2019-05-24 | Discharge: 2019-05-24 | Disposition: A | Discharge status: Home / Self Care | Payer: Medicaid Other | Source: Ambulatory Visit | Attending: Obstetrics and Gynecology | Admitting: Obstetrics and Gynecology

## 2019-05-24 ENCOUNTER — Other Ambulatory Visit: Payer: Self-pay | Admitting: Obstetrics and Gynecology

## 2019-05-24 ENCOUNTER — Other Ambulatory Visit: Payer: Self-pay

## 2019-05-24 DIAGNOSIS — N6312 Unspecified lump in the right breast, upper inner quadrant: Secondary | ICD-10-CM | POA: Diagnosis not present

## 2019-05-24 DIAGNOSIS — N6310 Unspecified lump in the right breast, unspecified quadrant: Secondary | ICD-10-CM

## 2019-05-25 ENCOUNTER — Other Ambulatory Visit: Payer: Self-pay | Admitting: Obstetrics and Gynecology

## 2019-05-28 ENCOUNTER — Telehealth: Payer: Self-pay | Admitting: Obstetrics and Gynecology

## 2019-05-28 NOTE — Telephone Encounter (Signed)
Patient informed of negative covid result. She verbalized understanding. °

## 2019-07-03 DIAGNOSIS — Z23 Encounter for immunization: Secondary | ICD-10-CM | POA: Diagnosis not present

## 2019-07-03 DIAGNOSIS — F419 Anxiety disorder, unspecified: Secondary | ICD-10-CM | POA: Diagnosis not present

## 2019-07-03 DIAGNOSIS — Z1331 Encounter for screening for depression: Secondary | ICD-10-CM | POA: Diagnosis not present

## 2019-07-03 DIAGNOSIS — L7211 Pilar cyst: Secondary | ICD-10-CM | POA: Diagnosis not present

## 2019-07-06 DIAGNOSIS — L720 Epidermal cyst: Secondary | ICD-10-CM | POA: Diagnosis not present

## 2019-10-25 DIAGNOSIS — T753XXD Motion sickness, subsequent encounter: Secondary | ICD-10-CM | POA: Diagnosis not present

## 2019-10-25 DIAGNOSIS — Z6825 Body mass index (BMI) 25.0-25.9, adult: Secondary | ICD-10-CM | POA: Diagnosis not present

## 2019-11-22 ENCOUNTER — Other Ambulatory Visit: Payer: BC Managed Care – PPO

## 2019-12-20 DIAGNOSIS — Z01419 Encounter for gynecological examination (general) (routine) without abnormal findings: Secondary | ICD-10-CM | POA: Diagnosis not present

## 2019-12-20 DIAGNOSIS — Z6825 Body mass index (BMI) 25.0-25.9, adult: Secondary | ICD-10-CM | POA: Diagnosis not present

## 2019-12-21 ENCOUNTER — Other Ambulatory Visit: Payer: BC Managed Care – PPO

## 2020-04-30 DIAGNOSIS — Z113 Encounter for screening for infections with a predominantly sexual mode of transmission: Secondary | ICD-10-CM | POA: Diagnosis not present

## 2020-04-30 DIAGNOSIS — R102 Pelvic and perineal pain: Secondary | ICD-10-CM | POA: Diagnosis not present

## 2020-05-28 DIAGNOSIS — N83202 Unspecified ovarian cyst, left side: Secondary | ICD-10-CM | POA: Diagnosis not present

## 2020-06-03 DIAGNOSIS — R002 Palpitations: Secondary | ICD-10-CM | POA: Diagnosis not present

## 2020-06-03 DIAGNOSIS — R635 Abnormal weight gain: Secondary | ICD-10-CM | POA: Diagnosis not present

## 2020-06-03 DIAGNOSIS — Z6828 Body mass index (BMI) 28.0-28.9, adult: Secondary | ICD-10-CM | POA: Diagnosis not present

## 2020-06-03 DIAGNOSIS — F419 Anxiety disorder, unspecified: Secondary | ICD-10-CM | POA: Diagnosis not present

## 2021-01-13 DIAGNOSIS — F419 Anxiety disorder, unspecified: Secondary | ICD-10-CM | POA: Diagnosis not present

## 2021-01-13 DIAGNOSIS — Z1331 Encounter for screening for depression: Secondary | ICD-10-CM | POA: Diagnosis not present

## 2021-01-13 IMAGING — MG DIGITAL DIAGNOSTIC UNILATERAL RIGHT MAMMOGRAM WITH TOMO AND CAD
4 series · 4 of 12 positions shown · non-contrast
Comparison: Previous exam(s).

CLINICAL DATA: 32-year-old female presenting from screening to
evaluate a possible right breast mass. The patient's mother has
history of breast cancer. Her mother also has history of
non-Hodgkin's lymphoma, and the subsequent breast cancer may have
been a result of radiation therapy for the lymphoma.

EXAM:
DIGITAL DIAGNOSTIC RIGHT MAMMOGRAM WITH CAD AND TOMO
ULTRASOUND RIGHT BREAST

[R CC synth-2D]
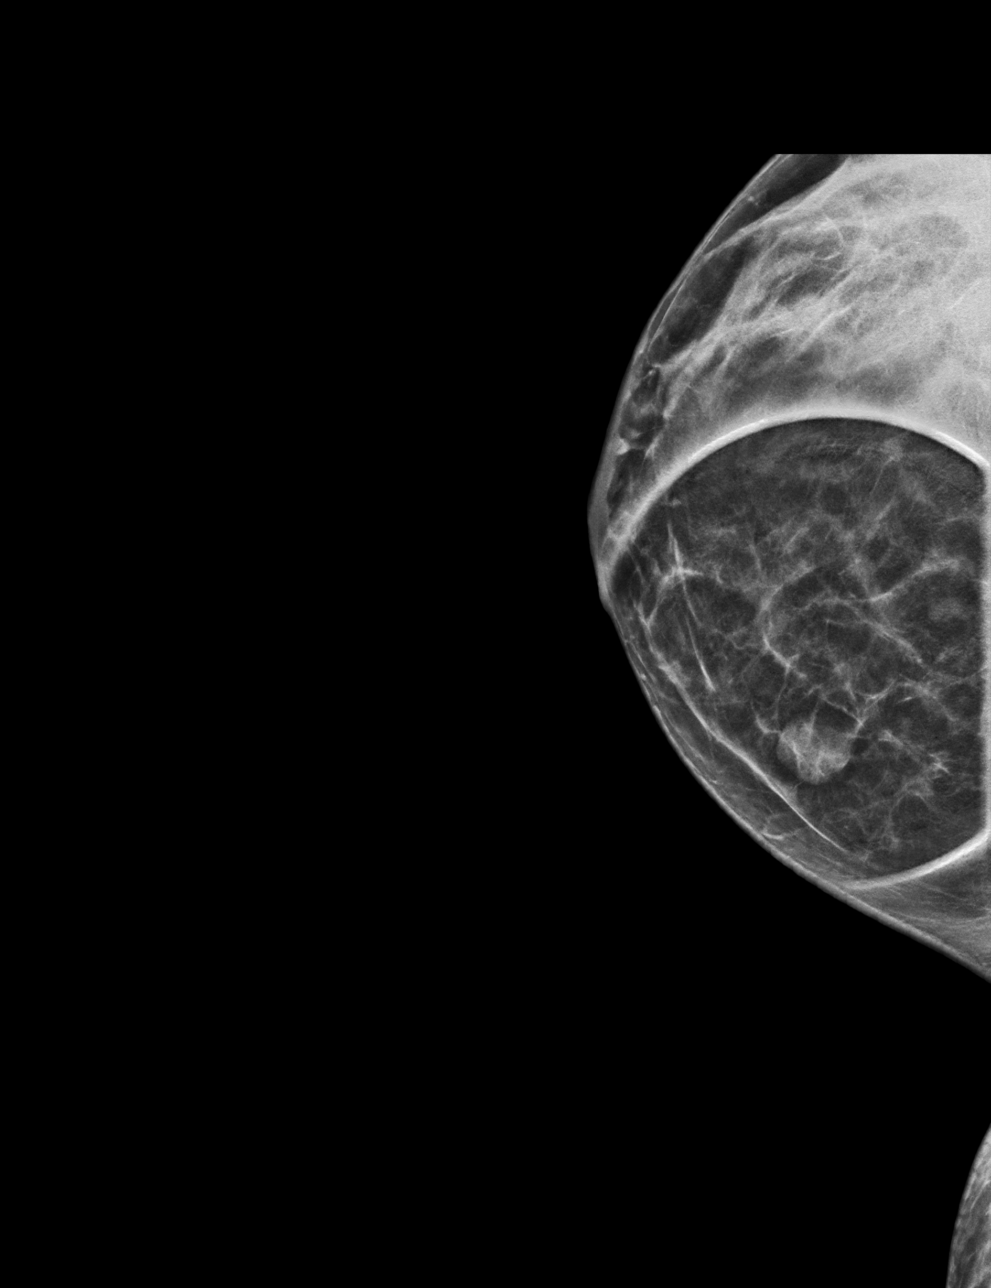

[R MLO synth-2D]
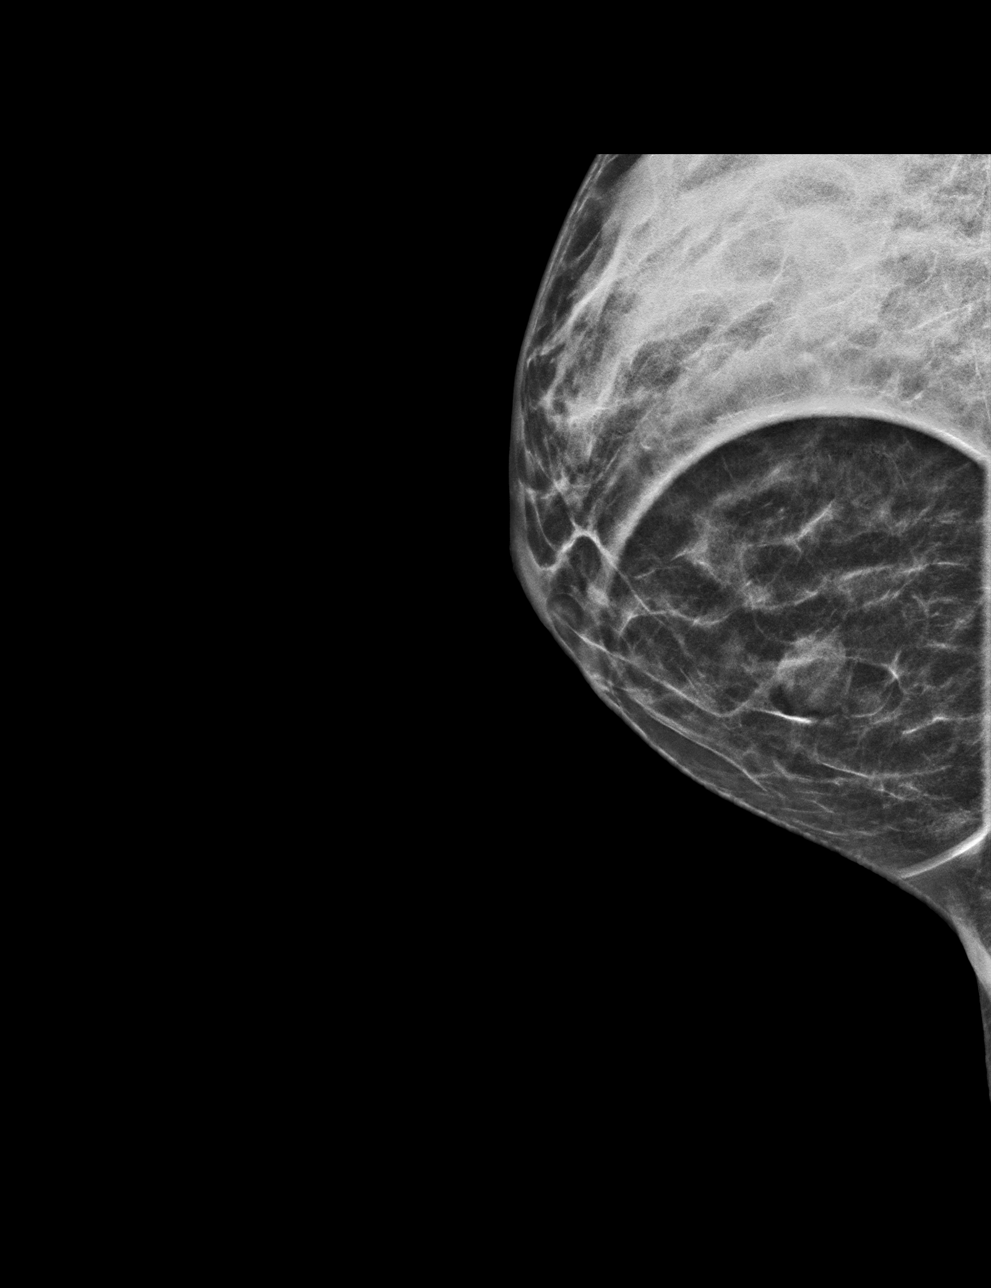

[R MLO tomo · tomo slice 22/43.0]
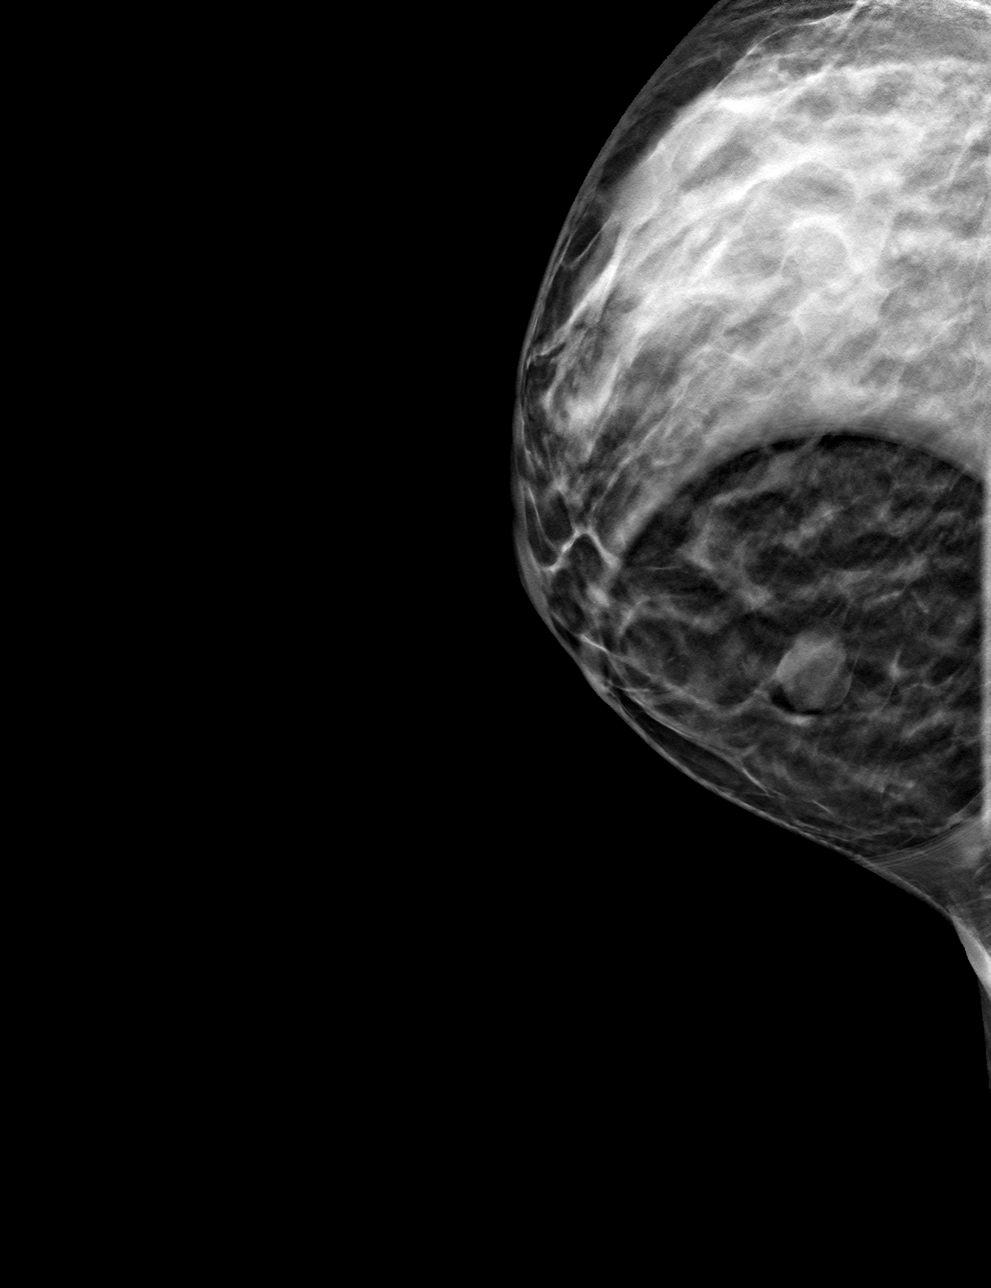

[R CC tomo · tomo slice 23/44.0]
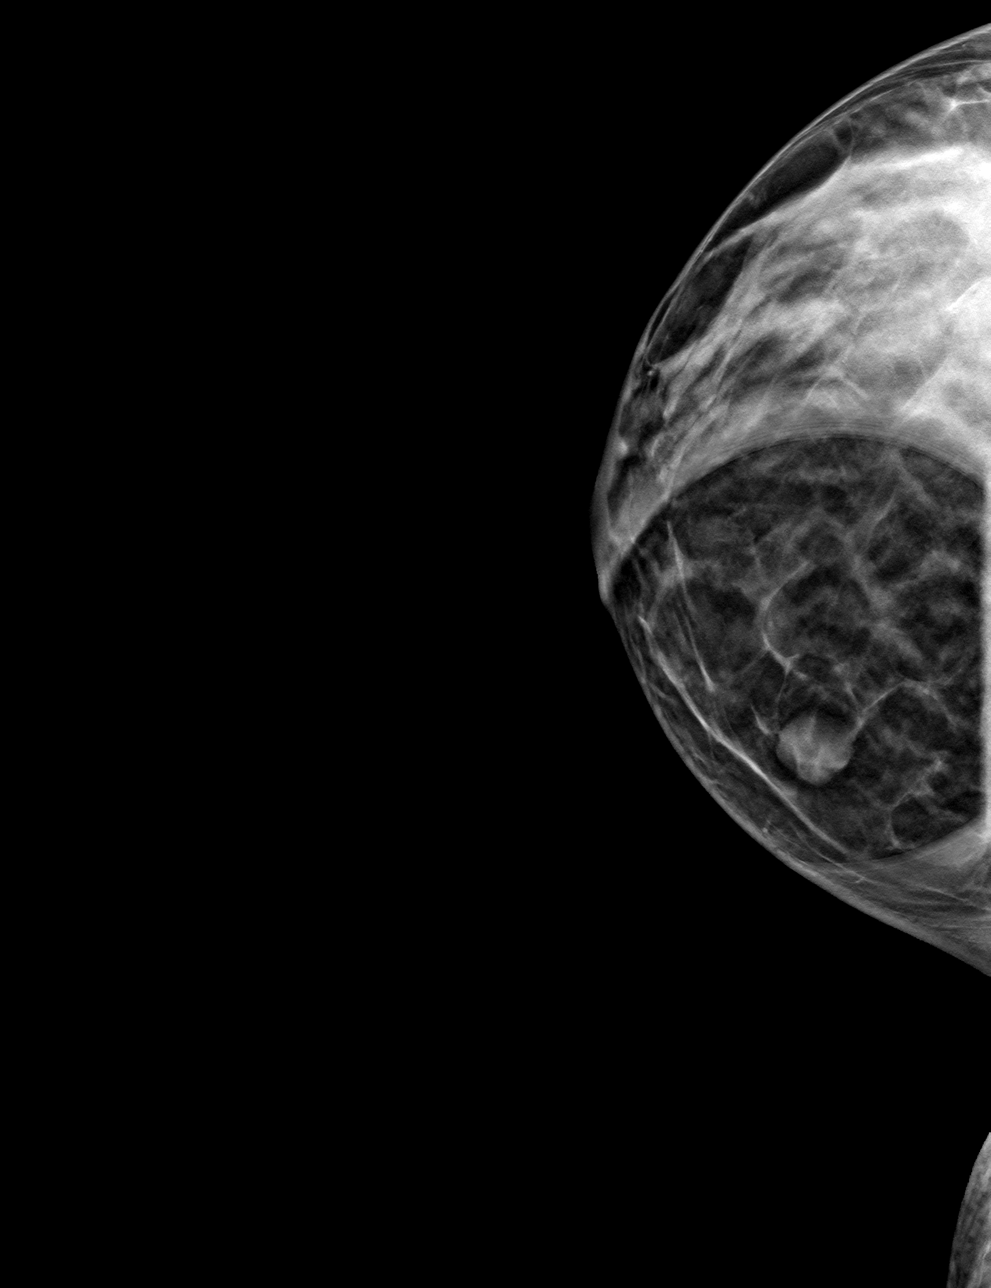

[4 of 12 positions shown; findings below may reference images not displayed]

ACR Breast Density Category c: The breast tissue is heterogeneously
dense, which may obscure small masses.
FINDINGS: In the lower-inner quadrant of the right breast, middle depth there
is a circumscribed oval lobulated mass measuring approximately
cm.

Mammographic images were processed with CAD.

Ultrasound of the right breast at 3 o'clock, 2 cm from the nipple
demonstrates an oval circumscribed hypoechoic mass measuring 1.5 x
0.6 x 1.2 cm.
IMPRESSION: 1. The right breast mass at 3 o'clock is probably benign, favored to
represent a fibroadenoma.

RECOMMENDATION:
Six-month follow-up right breast ultrasound.

I have discussed the findings and recommendations with the patient.
Results were also provided in writing at the conclusion of the
visit. If applicable, a reminder letter will be sent to the patient
regarding the next appointment.

BI-RADS CATEGORY  3: Probably benign.

## 2021-04-20 ENCOUNTER — Other Ambulatory Visit: Payer: Self-pay | Admitting: Obstetrics and Gynecology

## 2021-04-20 DIAGNOSIS — Z09 Encounter for follow-up examination after completed treatment for conditions other than malignant neoplasm: Secondary | ICD-10-CM

## 2021-05-19 ENCOUNTER — Other Ambulatory Visit: Payer: Self-pay | Admitting: Obstetrics and Gynecology

## 2021-05-19 DIAGNOSIS — N6310 Unspecified lump in the right breast, unspecified quadrant: Secondary | ICD-10-CM

## 2021-05-21 ENCOUNTER — Ambulatory Visit
Admission: RE | Admit: 2021-05-21 | Discharge: 2021-05-21 | Disposition: A | Discharge status: Home / Self Care | Payer: BC Managed Care – PPO | Source: Ambulatory Visit | Attending: Obstetrics and Gynecology | Admitting: Obstetrics and Gynecology

## 2021-05-21 ENCOUNTER — Other Ambulatory Visit: Payer: Self-pay

## 2021-05-21 DIAGNOSIS — R922 Inconclusive mammogram: Secondary | ICD-10-CM | POA: Diagnosis not present

## 2021-05-21 DIAGNOSIS — N6310 Unspecified lump in the right breast, unspecified quadrant: Secondary | ICD-10-CM

## 2021-05-21 DIAGNOSIS — N6489 Other specified disorders of breast: Secondary | ICD-10-CM | POA: Diagnosis not present

## 2021-06-03 DIAGNOSIS — Z01419 Encounter for gynecological examination (general) (routine) without abnormal findings: Secondary | ICD-10-CM | POA: Diagnosis not present

## 2021-06-03 DIAGNOSIS — N920 Excessive and frequent menstruation with regular cycle: Secondary | ICD-10-CM | POA: Diagnosis not present

## 2021-06-03 DIAGNOSIS — Z6826 Body mass index (BMI) 26.0-26.9, adult: Secondary | ICD-10-CM | POA: Diagnosis not present

## 2021-09-10 DIAGNOSIS — F419 Anxiety disorder, unspecified: Secondary | ICD-10-CM | POA: Diagnosis not present

## 2021-09-10 DIAGNOSIS — Z6827 Body mass index (BMI) 27.0-27.9, adult: Secondary | ICD-10-CM | POA: Diagnosis not present

## 2021-09-10 DIAGNOSIS — R002 Palpitations: Secondary | ICD-10-CM | POA: Diagnosis not present

## 2021-12-26 DIAGNOSIS — L7211 Pilar cyst: Secondary | ICD-10-CM | POA: Diagnosis not present

## 2022-03-31 DIAGNOSIS — Z6833 Body mass index (BMI) 33.0-33.9, adult: Secondary | ICD-10-CM | POA: Diagnosis not present

## 2022-03-31 DIAGNOSIS — R002 Palpitations: Secondary | ICD-10-CM | POA: Diagnosis not present

## 2022-03-31 DIAGNOSIS — Z1331 Encounter for screening for depression: Secondary | ICD-10-CM | POA: Diagnosis not present

## 2022-03-31 DIAGNOSIS — F419 Anxiety disorder, unspecified: Secondary | ICD-10-CM | POA: Diagnosis not present

## 2022-04-08 ENCOUNTER — Other Ambulatory Visit: Payer: Self-pay | Admitting: Obstetrics and Gynecology

## 2022-04-08 DIAGNOSIS — Z1231 Encounter for screening mammogram for malignant neoplasm of breast: Secondary | ICD-10-CM

## 2022-05-24 ENCOUNTER — Ambulatory Visit
Admission: RE | Admit: 2022-05-24 | Discharge: 2022-05-24 | Disposition: A | Discharge status: Home / Self Care | Payer: BC Managed Care – PPO | Source: Ambulatory Visit | Attending: Obstetrics and Gynecology | Admitting: Obstetrics and Gynecology

## 2022-05-24 DIAGNOSIS — Z1231 Encounter for screening mammogram for malignant neoplasm of breast: Secondary | ICD-10-CM

## 2022-07-23 DIAGNOSIS — Z01419 Encounter for gynecological examination (general) (routine) without abnormal findings: Secondary | ICD-10-CM | POA: Diagnosis not present

## 2022-07-23 DIAGNOSIS — Z124 Encounter for screening for malignant neoplasm of cervix: Secondary | ICD-10-CM | POA: Diagnosis not present

## 2022-07-23 DIAGNOSIS — Z1151 Encounter for screening for human papillomavirus (HPV): Secondary | ICD-10-CM | POA: Diagnosis not present

## 2022-07-23 DIAGNOSIS — Z1329 Encounter for screening for other suspected endocrine disorder: Secondary | ICD-10-CM | POA: Diagnosis not present

## 2022-07-23 DIAGNOSIS — Z6834 Body mass index (BMI) 34.0-34.9, adult: Secondary | ICD-10-CM | POA: Diagnosis not present

## 2022-08-31 ENCOUNTER — Ambulatory Visit: Payer: BC Managed Care – PPO | Admitting: Neurology

## 2022-09-09 DIAGNOSIS — J069 Acute upper respiratory infection, unspecified: Secondary | ICD-10-CM | POA: Diagnosis not present

## 2022-09-09 DIAGNOSIS — R6889 Other general symptoms and signs: Secondary | ICD-10-CM | POA: Diagnosis not present

## 2022-10-01 DIAGNOSIS — B001 Herpesviral vesicular dermatitis: Secondary | ICD-10-CM | POA: Diagnosis not present

## 2022-10-01 DIAGNOSIS — R002 Palpitations: Secondary | ICD-10-CM | POA: Diagnosis not present

## 2022-10-01 DIAGNOSIS — F419 Anxiety disorder, unspecified: Secondary | ICD-10-CM | POA: Diagnosis not present

## 2022-10-06 DIAGNOSIS — R197 Diarrhea, unspecified: Secondary | ICD-10-CM | POA: Diagnosis not present

## 2022-10-27 ENCOUNTER — Ambulatory Visit: Payer: BC Managed Care – PPO | Admitting: Neurology

## 2022-10-27 ENCOUNTER — Encounter: Payer: Self-pay | Admitting: Neurology

## 2022-10-27 VITALS — BP 113/68 | HR 83 | Ht 66.0 in | Wt 215.0 lb

## 2022-10-27 DIAGNOSIS — G43829 Menstrual migraine, not intractable, without status migrainosus: Secondary | ICD-10-CM | POA: Insufficient documentation

## 2022-10-27 DIAGNOSIS — G43009 Migraine without aura, not intractable, without status migrainosus: Secondary | ICD-10-CM | POA: Insufficient documentation

## 2022-10-27 MED ORDER — RIZATRIPTAN BENZOATE 10 MG PO TBDP: 10.0000 mg | ORAL_TABLET | ORAL | 11 refills | Status: DC | PRN

## 2022-10-27 MED ORDER — ONDANSETRON 4 MG PO TBDP: 4.0000 mg | ORAL_TABLET | Freq: Three times a day (TID) | ORAL | 3 refills | Status: AC | PRN

## 2022-10-27 MED ORDER — NURTEC 75 MG PO TBDP
75.0000 mg | ORAL_TABLET | Freq: Every day | ORAL | 11 refills | Status: DC | PRN
Start: 2022-10-27 — End: 2024-03-01

## 2022-10-27 NOTE — Progress Notes (Signed)
[facility]    Provider:  Dr Lucia GaskinsAhern Requesting Provider: Zelphia CairoAdkins, Gretchen, MD Primary Care Provider:  Ailene RavelHamrick, Maura L, MD  CC:  menstrual migraine  HPI:  Taylor Henry is a 37 y.o. female here as requested by Zelphia CairoAdkins, Gretchen, MD for migraines. During her menstrual cycle. Ibuprofen helps. Started a year ago. Right side, pulsating/pounding/throbbing, no auras, nausea, liht sensitivity, moving makes it worse, a dark quiet room helps. No vision changes, not positional or exertional, lasts 24-48 hours. Moderately severe to severe. About a day berfore menstrual cycle. Never had them before. No inciting events, no head trauma, 4 migraine days in a month. No vomiting. And a hangover the next day. No other focal neurologic deficits, associated symptoms, inciting events or modifiable factors.   Reviewed notes, labs and imaging from outside physicians, which showed migraines  Tried: sumatriptan, maxalt, nurtec, ubrelvy  Review of Systems: Patient complains of symptoms per HPI as well as the following symptoms migraines. Pertinent negatives and positives per HPI. All others negative.   Social History   Socioeconomic History   Marital status: Divorced    Spouse name: Not on file   Number of children: Not on file   Years of education: Not on file   Highest education level: Not on file  Occupational History   Not on file  Tobacco Use   Smoking status: Never   Smokeless tobacco: Never  Vaping Use   Vaping Use: Never used  Substance and Sexual Activity   Alcohol use: No   Drug use: No   Sexual activity: Yes    Birth control/protection: OCP  Other Topics Concern   Not on file  Social History Narrative   Lives alone with her children   Right handed   Caffeine: at least 2 cups/day (coffee in AM and a caffeine drink with dinner)   Social Determinants of Health   Financial Resource Strain: Not on file  Food Insecurity: Not on file  Transportation Needs: Not on  file  Physical Activity: Not on file  Stress: Not on file  Social Connections: Not on file  Intimate Partner Violence: Not on file    Family History  Problem Relation Age of Onset   Breast cancer Mother [id number]   Hodgkin's lymphoma Mother    Hyperlipidemia Father    Hypertension Father    Heart disease Paternal Grandmother    Hypertension Paternal Grandmother    Diabetes Neg Hx    Migraines Neg Hx     Past Medical History:  Diagnosis Date   Anemia    hx of anemia   Anxiety    HSV (herpes simplex virus) infection    ORAL   Hypoglycemia    Normal vaginal delivery 02/2016   Recent URI    Wears glasses     Patient Active Problem List   Diagnosis Date Noted   Migraine without aura and without status migrainosus, not intractable 10/27/2022   Menstrual migraine without status migrainosus, not intractable 10/27/2022   Pregnancy 03/26/2016    Past Surgical History:  Procedure Laterality Date   ADENOIDECTOMY  1991   LAPAROSCOPIC TUBAL LIGATION Bilateral 07/02/2016   Procedure: LAPAROSCOPIC TUBAL LIGATION with filshie clips;  Surgeon: Zelphia CairoGretchen Adkins, MD;  [facility] [place];  Service: Gynecology;  Laterality: Bilateral;   TONSILLECTOMY      Current Outpatient Medications  Medication Sig Dispense Refill   acyclovir (ZOVIRAX) 200 MG capsule Take 200 mg by mouth daily.     hydrOXYzine (ATARAX/VISTARIL)  10 MG tablet Take 1 tablet (10 mg total) by mouth 3 (three) times daily as needed. 20 tablet 0   JENCYCLA 0.35 MG tablet Take 1 tablet by mouth daily.     ondansetron (ZOFRAN-ODT) 4 MG disintegrating tablet Take 1-2 tablets (4-8 mg total) by mouth every 8 (eight) hours as needed. 30 tablet 3   Rimegepant Sulfate (NURTEC) 75 MG TBDP Take 1 tablet (75 mg total) by mouth daily as needed. For migraines. Take as close to onset of migraine as possible. One daily maximum. 16 tablet 11   rizatriptan (MAXALT-MLT) 10 MG disintegrating tablet Take 1 tablet (10 mg total) by  mouth as needed for migraine. May repeat in 2 hours if needed 9 tablet 11   sertraline (ZOLOFT) 100 MG tablet Take 100 mg by mouth at bedtime.     No current facility-administered medications for this visit.    Allergies as of 10/27/2022 - Review Complete 10/27/2022  Allergen Reaction Noted   Codeine Nausea And Vomiting 04/15/2013   Penicillins Rash 04/15/2013    Vitals: BP 113/68 (BP Location: Right Arm, Patient Position: Sitting)   Pulse 83   Ht 5\' 6"  (1.676 m)   Wt 215 lb (97.5 kg)   BMI 34.70 kg/m  Last Weight:  Wt Readings from Last 1 Encounters:  10/27/22 215 lb (97.5 kg)   Last Height:   Ht Readings from Last 1 Encounters:  10/27/22 5\' 6"  (1.676 m)     Physical exam: Exam: Gen: NAD, conversant, well nourised, obese, well groomed                     CV: RRR, no MRG. No Carotid Bruits. No peripheral edema, warm, nontender Eyes: Conjunctivae clear without exudates or hemorrhage  Neuro: Detailed Neurologic Exam  Speech:    Speech is normal; fluent and spontaneous with normal comprehension.  Cognition:    The patient is oriented to person, place, and time;     recent and remote memory intact;     language fluent;     normal attention, concentration,     fund of knowledge Cranial Nerves:    The pupils are equal, round, and reactive to light. The fundi are normal and spontaneous venous pulsations are present. Visual fields are full to finger confrontation. Extraocular movements are intact. Trigeminal sensation is intact and the muscles of mastication are normal. The face is symmetric. The palate elevates in the midline. Hearing intact. Voice is normal. Shoulder shrug is normal. The tongue has normal motion without fasciculations.   Coordination:    Normal finger to nose and heel to shin. Normal rapid alternating movements.   Gait:    Heel-toe and tandem gait are normal.   Motor Observation:    No asymmetry, no atrophy, and no involuntary movements  noted. Tone:    Normal muscle tone.    Posture:    Posture is normal. normal erect    Strength:    Strength is V/V in the upper and lower limbs.      Sensation: intact to LT     Reflex Exam:  DTR's:    Deep tendon reflexes in the upper and lower extremities are normal bilaterally.   Toes:    The toes are downgoing bilaterally.   Clonus:    Clonus is absent.    Assessment/Plan:  Menstrual migraines  Options discussed in detail:  1. Nuva ring(or other) every week for 3 months and then have a period discuss with obgyn  Or an estrogen patch during the menstrual cycle.  2. If you know when you have cycle, take alleve 2x a day even before the migraine starts for a few days 3. At onset of migraine, rizatriptan 10mg  and ondansetron 4mg . Then repeat in 2 hours if needed.RIGHT AWAY. Only lasts 2 hours or so, so for acute not preventative 4. Nurtec daily during period once daily even before the headache starts for 4-7 days daily wake up and take even without  a headache for preventation  Next steps: could try ubrelvy, zavagepant, frova (26 hour half life)  Orders Placed This Encounter  Procedures   CBC with Differential/Platelets   Comprehensive metabolic panel   Meds ordered this encounter  Medications   rizatriptan (MAXALT-MLT) 10 MG disintegrating tablet    Sig: Take 1 tablet (10 mg total) by mouth as needed for migraine. May repeat in 2 hours if needed    Dispense:  9 tablet    Refill:  11   ondansetron (ZOFRAN-ODT) 4 MG disintegrating tablet    Sig: Take 1-2 tablets (4-8 mg total) by mouth every 8 (eight) hours as needed.    Dispense:  30 tablet    Refill:  3   Rimegepant Sulfate (NURTEC) 75 MG TBDP    Sig: Take 1 tablet (75 mg total) by mouth daily as needed. For migraines. Take as close to onset of migraine as possible. One daily maximum.    Dispense:  16 tablet    Refill:  11    4-5 migraines monthly. < 8 total headache days a month. Tried imitrex and maxalt.     Cc: Zelphia Cairo, MD,  Hamrick, Durward Fortes, MD  Naomie Dean, MD  [facility] [address] [place], [place] [address]  Phone [phone] Fax [phone]  I spent over 45 minutes of face-to-face and non-face-to-face time with patient on the  1. Migraine without aura and without status migrainosus, not intractable   2. Menstrual migraine without status migrainosus, not intractable    diagnosis.  This included previsit chart review, lab review, study review, order entry, electronic health record documentation, patient education on the different diagnostic and therapeutic options, counseling and coordination of care, risks and benefits of management, compliance, or risk factor reduction

## 2022-10-27 NOTE — Patient Instructions (Addendum)
Options  1. Nuva ring every week for 3 months and then have a period discuss with obgyn Or an estrogen patch during the menstrual cycle.  2. If you know when you have cycle, take alleve 2x a day even before the migraine starts for a few days 3. At onset of migraine, rizatriptan 10mg  and ondansetron 4mg . Then repeat in 2 hours if needed.RIGHT AWAY. Only lasts 2 hours or so, so for acute not preventative 4. Nurtec daily during period once daily even before the headache starts for 4-7 days daily wak eup and take even without  a headache for preventation  Next steps: could try ubrelvy, zavagepant, frova (26 hour half life)  Rimegepant Disintegrating Tablets What is this medication? RIMEGEPANT (ri ME je pant) prevents and treats migraines. It works by blocking a substance in the body that causes migraines. This medicine may be used for other purposes; ask your health care provider or pharmacist if you have questions. COMMON BRAND NAME(S): NURTEC ODT What should I tell my care team before I take this medication? They need to know if you have any of these conditions: Kidney disease Liver disease An unusual or allergic reaction to rimegepant, other medications, foods, dyes, or preservatives Pregnant or trying to get pregnant Breast-feeding How should I use this medication? Take this medication by mouth. Take it as directed on the prescription label. Leave the tablet in the sealed pack until you are ready to take it. With dry hands, open the pack and gently remove the tablet. If the tablet breaks or crumbles, throw it away. Use a new tablet. Place the tablet in the mouth and allow it to dissolve. Then, swallow it. Do not cut, crush, or chew this medication. You do not need water to take this medication. Talk to your care team about the use of this medication in children. Special care may be needed. Overdosage: If you think you have taken too much of this medicine contact a poison control center or  emergency room at once. NOTE: This medicine is only for you. Do not share this medicine with others. What if I miss a dose? This does not apply. This medication is not for regular use. What may interact with this medication? Certain medications for fungal infections, such as fluconazole, itraconazole Rifampin This list may not describe all possible interactions. Give your health care provider a list of all the medicines, herbs, non-prescription drugs, or dietary supplements you use. Also tell them if you smoke, drink alcohol, or use illegal drugs. Some items may interact with your medicine. What should I watch for while using this medication? Visit your care team for regular checks on your progress. Tell your care team if your symptoms do not start to get better or if they get worse. What side effects may I notice from receiving this medication? Side effects that you should report to your care team as soon as possible: Allergic reactions--skin rash, itching, hives, swelling of the face, lips, tongue, or throat Side effects that usually do not require medical attention (report to your care team if they continue or are bothersome): Nausea Stomach pain This list may not describe all possible side effects. Call your doctor for medical advice about side effects. You may report side effects to FDA at 1-800-FDA-1088. Where should I keep my medication? Keep out of the reach of children and pets. Store at room temperature between 20 and 25 degrees C (68 and 77 degrees F). Get rid of any unused medication after  the expiration date. To get rid of medications that are no longer needed or have expired: Take the medication to a medication take-back program. Check with your pharmacy or law enforcement to find a location. If you cannot return the medication, check the label or package insert to see if the medication should be thrown out in the garbage or flushed down the toilet. If you are not sure, ask your  care team. If it is safe to put it in the trash, take the medication out of the container. Mix the medication with cat litter, dirt, coffee grounds, or other unwanted substance. Seal the mixture in a bag or container. Put it in the trash. NOTE: This sheet is a summary. It may not cover all possible information. If you have questions about this medicine, talk to your doctor, pharmacist, or health care provider.  2023 Elsevier/Gold Standard (2021-09-02 00:00:00) Rizatriptan Disintegrating Tablets What is this medication? RIZATRIPTAN (rye za TRIP tan) treats migraines. It works by blocking pain signals and narrowing blood vessels in the brain. It belongs to a group of medications called triptans. It is not used to prevent migraines. This medicine may be used for other purposes; ask your health care provider or pharmacist if you have questions. COMMON BRAND NAME(S): Maxalt-MLT What should I tell my care team before I take this medication? They need to know if you have any of these conditions: Circulation problems in fingers and toes Diabetes Heart disease High blood pressure High cholesterol History of irregular heartbeat History of stroke Stomach or intestine problems Tobacco use An unusual or allergic reaction to rizatriptan, other medications, foods, dyes, or preservatives Pregnant or trying to get pregnant Breast-feeding How should I use this medication? Take this medication by mouth. Take it as directed on the prescription label. You do not need water to take this medication. Leave the tablet in the sealed pack until you are ready to take it. With dry hands, open the pack and gently remove the tablet. If the tablet breaks or crumbles, throw it away. Use a new tablet. Place the tablet on the tongue and allow it to dissolve. Then, swallow it. Do not cut, crush, or chew this medication. Do not use it more often than directed. Talk to your care team about the use of this medication in children.  While it may be prescribed for children as young as 6 years for selected conditions, precautions do apply. Overdosage: If you think you have taken too much of this medicine contact a poison control center or emergency room at once. NOTE: This medicine is only for you. Do not share this medicine with others. What if I miss a dose? This does not apply. This medication is not for regular use. What may interact with this medication? Do not take this medication with any of the following: Ergot alkaloids, such as dihydroergotamine, ergotamine MAOIs, such as Marplan, Nardil, Parnate Other medications for migraine headache, such as almotriptan, eletriptan, frovatriptan, naratriptan, sumatriptan, zolmitriptan This medication may also interact with the following: Certain medications for depression, anxiety, or other mental health conditions Propranolol This list may not describe all possible interactions. Give your health care provider a list of all the medicines, herbs, non-prescription drugs, or dietary supplements you use. Also tell them if you smoke, drink alcohol, or use illegal drugs. Some items may interact with your medicine. What should I watch for while using this medication? Visit your care team for regular checks on your progress. Tell your care team if your  symptoms do not start to get better or if they get worse. This medication may affect your coordination, reaction time, or judgment. Do not drive or operate machinery until you know how this medication affects you. Sit up or stand slowly to reduce the risk of dizzy or fainting spells. If you take migraine medications for 10 or more days a month, your migraines may get worse. Keep a diary of headache days and medication use. Contact your care team if your migraine attacks occur more frequently. What side effects may I notice from receiving this medication? Side effects that you should report to your care team as soon as possible: Allergic  reactions--skin rash, itching, hives, swelling of the face, lips, tongue, or throat Burning, pain, tingling, or color changes in the hands, arms, legs, or feet Heart attack--pain or tightness in the chest, shoulders, arms, or jaw, nausea, shortness of breath, cold or clammy skin, feeling faint or lightheaded Heart rhythm changes--fast or irregular heartbeat, dizziness, feeling faint or lightheaded, chest pain, trouble breathing Increase in blood pressure Irritability, confusion, fast or irregular heartbeat, muscle stiffness, twitching muscles, sweating, high fever, seizure, chills, vomiting, diarrhea, which may be signs of serotonin syndrome Raynaud syndrome--cool, numb, or painful fingers or toes that may change color from pale, to blue, to red Seizures Stroke--sudden numbness or weakness of the face, arm, or leg, trouble speaking, confusion, trouble walking, loss of balance or coordination, dizziness, severe headache, change in vision Sudden or severe stomach pain, bloody diarrhea, fever, nausea, vomiting Vision loss Side effects that usually do not require medical attention (report to your care team if they continue or are bothersome): Dizziness Unusual weakness or fatigue This list may not describe all possible side effects. Call your doctor for medical advice about side effects. You may report side effects to FDA at 1-800-FDA-1088. Where should I keep my medication? Keep out of the reach of children and pets. Store at room temperature between 15 and 30 degrees C (59 and 86 degrees F). Protect from light and moisture. Get rid of any unused medication after the expiration date. To get rid of medications that are no longer needed or have expired: Take the medication to a medication take-back program. Check with your pharmacy or law enforcement to find a location. If you cannot return the medication, check the label or package insert to see if the medication should be thrown out in the garbage  or flushed down the toilet. If you are not sure, ask your care team. If it is safe to put it in the trash, empty the medication out of the container. Mix the medication with cat litter, dirt, coffee grounds, or other unwanted substance. Seal the mixture in a bag or container. Put it in the trash. NOTE: This sheet is a summary. It may not cover all possible information. If you have questions about this medicine, talk to your doctor, pharmacist, or health care provider.  2023 Elsevier/Gold Standard (2021-11-12 00:00:00) Ondansetron Dissolving Tablets What is this medication? ONDANSETRON (on DAN se tron) prevents nausea and vomiting from chemotherapy, radiation, or surgery. It works by blocking substances in the body that may cause nausea or vomiting. It belongs to a group of medications called antiemetics. This medicine may be used for other purposes; ask your health care provider or pharmacist if you have questions. COMMON BRAND NAME(S): Zofran ODT What should I tell my care team before I take this medication? They need to know if you have any of these conditions: Heart disease  History of irregular heartbeat Liver disease Low levels of magnesium or potassium in the blood An unusual or allergic reaction to ondansetron, granisetron, other medications, foods, dyes, or preservatives Pregnant or trying to get pregnant Breast-feeding How should I use this medication? These tablets are made to dissolve in the mouth. Do not try to push the tablet through the foil backing. With dry hands, peel away the foil backing and gently remove the tablet. Place the tablet in the mouth and allow it to dissolve, then swallow. While you may take these tablets with water, it is not necessary to do so. Talk to your care team regarding the use of this medication in children. Special care may be needed. Overdosage: If you think you have taken too much of this medicine contact a poison control center or emergency room at  once. NOTE: This medicine is only for you. Do not share this medicine with others. What if I miss a dose? If you miss a dose, take it as soon as you can. If it is almost time for your next dose, take only that dose. Do not take double or extra doses. What may interact with this medication? Do not take this medication with any of the following: Apomorphine Certain medications for fungal infections like fluconazole, itraconazole, ketoconazole, posaconazole, voriconazole Cisapride Dronedarone Pimozide Thioridazine This medication may also interact with the following: Carbamazepine Certain medications for depression, anxiety, or psychotic disturbances Fentanyl Linezolid MAOIs like Carbex, Eldepryl, Marplan, Nardil, and Parnate Methylene blue (injected into a vein) Other medications that prolong the QT interval (cause an abnormal heart rhythm) like dofetilide, ziprasidone Phenytoin Rifampicin Tramadol This list may not describe all possible interactions. Give your health care provider a list of all the medicines, herbs, non-prescription drugs, or dietary supplements you use. Also tell them if you smoke, drink alcohol, or use illegal drugs. Some items may interact with your medicine. What should I watch for while using this medication? Check with your care team as soon as you can if you have any sign of an allergic reaction. What side effects may I notice from receiving this medication? Side effects that you should report to your care team as soon as possible: Allergic reactions--skin rash, itching, hives, swelling of the face, lips, tongue, or throat Bowel blockage--stomach cramping, unable to have a bowel movement or pass gas, loss of appetite, vomiting Chest pain (angina)--pain, pressure, or tightness in the chest, neck, back, or arms Heart rhythm changes--fast or irregular heartbeat, dizziness, feeling faint or lightheaded, chest pain, trouble breathing Irritability, confusion, fast or  irregular heartbeat, muscle stiffness, twitching muscles, sweating, high fever, seizure, chills, vomiting, diarrhea, which may be signs of serotonin syndrome Side effects that usually do not require medical attention (report to your care team if they continue or are bothersome): Constipation Diarrhea General discomfort and fatigue Headache This list may not describe all possible side effects. Call your doctor for medical advice about side effects. You may report side effects to FDA at 1-800-FDA-1088. Where should I keep my medication? Keep out of the reach of children and pets. Store between 2 and 30 degrees C (36 and 86 degrees F). Throw away any unused medication after the expiration date. NOTE: This sheet is a summary. It may not cover all possible information. If you have questions about this medicine, talk to your doctor, pharmacist, or health care provider.  2023 Elsevier/Gold Standard (2007-09-02 00:00:00)

## 2022-10-28 ENCOUNTER — Encounter: Payer: Self-pay | Admitting: Neurology

## 2022-10-28 LAB — COMPREHENSIVE METABOLIC PANEL
ALT: 15 IU/L (ref 0–32)
AST: 14 IU/L (ref 0–40)
Albumin/Globulin Ratio: 1.3 (ref 1.2–2.2)
Albumin: 4.1 g/dL (ref 3.9–4.9)
Alkaline Phosphatase: 92 IU/L (ref 44–121)
BUN/Creatinine Ratio: 13 (ref 9–23)
BUN: 10 mg/dL (ref 6–20)
Bilirubin Total: <0.2 mg/dL (ref 0.0–1.2)
CO2: 23 mmol/L (ref 20–29)
Calcium: 8.8 mg/dL (ref 8.7–10.2)
Chloride: 107 mmol/L — ABNORMAL HIGH (ref 96–106)
Creatinine, Ser: 0.77 mg/dL (ref 0.57–1.00)
Globulin, Total: 3.1 g/dL (ref 1.5–4.5)
Glucose: 75 mg/dL (ref 70–99)
Potassium: 4.4 mmol/L (ref 3.5–5.2)
Sodium: 140 mmol/L (ref 134–144)
Total Protein: 7.2 g/dL (ref 6.0–8.5)
eGFR: 102 mL/min/{1.73_m2} (ref >59)

## 2022-10-28 LAB — CBC WITH DIFFERENTIAL/PLATELET
Basophils Absolute: 0.1 10*3/uL (ref 0.0–0.2)
Basos: 1 %
EOS (ABSOLUTE): 0.4 10*3/uL (ref 0.0–0.4)
Eos: 7 %
Hematocrit: 41.2 % (ref 34.0–46.6)
Hemoglobin: 13.3 g/dL (ref 11.1–15.9)
Immature Grans (Abs): 0 10*3/uL (ref 0.0–0.1)
Immature Granulocytes: 0 %
Lymphocytes Absolute: 1.9 10*3/uL (ref 0.7–3.1)
Lymphs: 29 %
MCH: 27.7 pg (ref 26.6–33.0)
MCHC: 32.3 g/dL (ref 31.5–35.7)
MCV: 86 fL (ref 79–97)
Monocytes Absolute: 0.7 10*3/uL (ref 0.1–0.9)
Monocytes: 10 %
Neutrophils Absolute: 3.3 10*3/uL (ref 1.4–7.0)
Neutrophils: 53 %
Platelets: 273 10*3/uL (ref 150–450)
RBC: 4.8 x10E6/uL (ref 3.77–5.28)
RDW: 13.1 % (ref 11.7–15.4)
WBC: 6.4 10*3/uL (ref 3.4–10.8)

## 2022-10-28 NOTE — Telephone Encounter (Signed)
    [name]! If you would like to forego insurance for the Rizatriptan and try to use this coupon for cash payment you can. The one Dr Jaynee Eagles prescribed should be generic. It does melt in your mouth so if you wish to have the one that you swallow then that might be a little cheaper? The coupons I posted above includes one for the disintegrating tablet and one that you swallow. Which do you prefer?

## 2022-10-31 ENCOUNTER — Encounter: Payer: Self-pay | Admitting: Neurology

## 2023-02-22 ENCOUNTER — Telehealth: Payer: Self-pay | Admitting: Family Medicine

## 2023-02-22 NOTE — Telephone Encounter (Signed)
LVM and sent mychart msg informing pt of need to reschedule 03/10/23 appt - NP out

## 2023-03-10 ENCOUNTER — Telehealth: Payer: BC Managed Care – PPO | Admitting: Family Medicine

## 2023-04-05 DIAGNOSIS — B001 Herpesviral vesicular dermatitis: Secondary | ICD-10-CM | POA: Diagnosis not present

## 2023-04-05 DIAGNOSIS — R002 Palpitations: Secondary | ICD-10-CM | POA: Diagnosis not present

## 2023-04-05 DIAGNOSIS — Z1331 Encounter for screening for depression: Secondary | ICD-10-CM | POA: Diagnosis not present

## 2023-04-05 DIAGNOSIS — G4700 Insomnia, unspecified: Secondary | ICD-10-CM | POA: Diagnosis not present

## 2023-04-05 DIAGNOSIS — F419 Anxiety disorder, unspecified: Secondary | ICD-10-CM | POA: Diagnosis not present

## 2023-07-14 DIAGNOSIS — R590 Localized enlarged lymph nodes: Secondary | ICD-10-CM | POA: Diagnosis not present

## 2023-07-26 DIAGNOSIS — R590 Localized enlarged lymph nodes: Secondary | ICD-10-CM | POA: Diagnosis not present

## 2024-03-01 ENCOUNTER — Telehealth: Payer: Self-pay | Admitting: Neurology

## 2024-03-01 DIAGNOSIS — G43009 Migraine without aura, not intractable, without status migrainosus: Secondary | ICD-10-CM

## 2024-03-01 MED ORDER — NURTEC 75 MG PO TBDP
75.0000 mg | ORAL_TABLET | Freq: Every day | ORAL | 0 refills | Status: DC | PRN
Start: 2024-03-01 — End: 2024-04-06

## 2024-03-01 NOTE — Telephone Encounter (Signed)
Refill request sent to pharmacy this afternoon

## 2024-03-01 NOTE — Telephone Encounter (Signed)
 Pt called to request refill medication . Pt is aware she has not seen MD  in a year . Appt scheduled for 04/06/24  Rimegepant Sulfate (NURTEC) 75 MG TBDP   Pt  would like medication to be sent to   [id number] [place], [address] - [address] AT [employer] [facility] Phone: [phone]  Fax: [phone]

## 2024-03-06 ENCOUNTER — Telehealth: Payer: Self-pay

## 2024-03-06 ENCOUNTER — Other Ambulatory Visit (HOSPITAL_COMMUNITY): Payer: Self-pay

## 2024-03-06 NOTE — Telephone Encounter (Signed)
 Pt has appt 04/06/24

## 2024-03-06 NOTE — Telephone Encounter (Signed)
 Pharmacy Patient Advocate Encounter   Received notification from Fax that prior authorization for Nurtec is due for renewal.   Insurance verification completed.   The patient is insured through [employer].  Action: Patient hasn't been seen in your office in over a year. Plan requires updated chart notes for PA renewal.

## 2024-03-27 ENCOUNTER — Other Ambulatory Visit (HOSPITAL_COMMUNITY): Payer: Self-pay

## 2024-04-05 NOTE — Patient Instructions (Incomplete)
 Below is our plan:  We will continue rizatriptan , ondansetron  and Nurtec as needed for migraine abortion.   Please make sure you are staying well hydrated. I recommend 50-60 ounces daily. Well balanced diet and regular exercise encouraged. Consistent sleep schedule with 6-8 hours recommended.   Please continue follow up with care team as directed.   Follow up with me in 1 year   You may receive a survey regarding today's visit. I encourage you to leave honest feed back as I do use this information to improve patient care. Thank you for seeing me today!   GENERAL HEADACHE INFORMATION:   Natural supplements: Magnesium Oxide or Magnesium Glycinate 500 mg at bed (up to 800 mg daily) Coenzyme Q10 300 mg in AM Vitamin B2- 200 mg twice a day   Add 1 supplement at a time since even natural supplements can have undesirable side effects. You can sometimes buy supplements cheaper (especially Coenzyme Q10) at www.WebmailGuide.co.za or at [facility].  Migraine with aura: There is increased risk for stroke in women with migraine with aura and a contraindication for the combined contraceptive pill for use by women who have migraine with aura. The risk for women with migraine without aura is lower. However other risk factors like smoking are far more likely to increase stroke risk than migraine. There is a recommendation for no smoking and for the use of OCPs without estrogen such as progestogen only pills particularly for women with migraine with aura.SABRA People who have migraine headaches with auras may be 3 times more likely to have a stroke caused by a blood clot, compared to migraine patients who don't see auras. Women who take hormone-replacement therapy may be 30 percent more likely to suffer a clot-based stroke than women not taking medication containing estrogen. Other risk factors like smoking and high blood pressure may be  much more important.    Vitamins and herbs that show potential:   Magnesium: Magnesium  (250 mg twice a day or 500 mg at bed) has a relaxant effect on smooth muscles such as blood vessels. Individuals suffering from frequent or daily headache usually have low magnesium levels which can be increase with daily supplementation of 400-750 mg. Three trials found 40-90% average headache reduction  when used as a preventative. Magnesium may help with headaches are aura, the best evidence for magnesium is for migraine with aura is its thought to stop the cortical spreading depression we believe is the pathophysiology of migraine aura.Magnesium also demonstrated the benefit in menstrually related migraine.  Magnesium is part of the messenger system in the serotonin cascade and it is a good muscle relaxant.  It is also useful for constipation which can be a side effect of other medications used to treat migraine. Good sources include nuts, whole grains, and tomatoes. Side Effects: loose stool/diarrhea  Riboflavin (vitamin B 2) 200 mg twice a day. This vitamin assists nerve cells in the production of ATP a principal energy storing molecule.  It is necessary for many chemical reactions in the body.  There have been at least 3 clinical trials of riboflavin using 400 mg per day all of which suggested that migraine frequency can be decreased.  All 3 trials showed significant improvement in over half of migraine sufferers.  The supplement is found in bread, cereal, milk, meat, and poultry.  Most Americans get more riboflavin than the recommended daily allowance, however riboflavin deficiency is not necessary for the supplements to help prevent headache. Side effects: energizing, green urine  Coenzyme Q10: This is present in almost all cells in the body and is critical component for the conversion of energy.  Recent studies have shown that a nutritional supplement of CoQ10 can reduce the frequency of migraine attacks by improving the energy production of cells as with riboflavin.  Doses of 150 mg twice a day have  been shown to be effective.   Melatonin: Increasing evidence shows correlation between melatonin secretion and headache conditions.  Melatonin supplementation has decreased headache intensity and duration.  It is widely used as a sleep aid.  Sleep is natures way of dealing with migraine.  A dose of 3 mg is recommended to start for headaches including cluster headache. Higher doses up to 15 mg has been reviewed for use in Cluster headache and have been used. The rationale behind using melatonin for cluster is that many theories regarding the cause of Cluster headache center around the disruption of the normal circadian rhythm in the brain.  This helps restore the normal circadian rhythm.   HEADACHE DIET: Foods and beverages which may trigger migraine Note that only 20% of headache patients are food sensitive. You will know if you are food sensitive if you get a headache consistently 20 minutes to 2 hours after eating a certain food. Only cut out a food if it causes headaches, otherwise you might remove foods you enjoy! What matters most for diet is to eat a well balanced healthy diet full of vegetables and low fat protein, and to not miss meals.   Chocolate, other sweets ALL cheeses except cottage and cream cheese Dairy products, yogurt, sour cream, ice cream Liver Meat extracts (Bovril, Marmite, meat tenderizers) Meats or fish which have undergone aging, fermenting, pickling or smoking. These include: Hotdogs,salami,Lox,sausage, mortadellas,smoked salmon, pepperoni, Pickled herring Pods of broad bean (English beans, Chinese pea pods, [place] (fava) beans, lima and navy beans Ripe avocado, ripe banana Yeast extracts or active yeast preparations such as Brewer's or Fleishman's (commercial bakes goods are permitted) Tomato based foods, pizza (lasagna, etc.)   MSG (monosodium glutamate) is disguised as many things; look for these common aliases: Monopotassium glutamate Autolysed yeast Hydrolysed  protein Sodium caseinate "flavorings" "all natural preservatives Nutrasweet   Avoid all other foods that convincingly provoke headaches.   Resources: The Dizzy Bluford Aid Your Headache Diet, migrainestrong.com  https://zamora-andrews.com/   Caffeine and Migraine For patients that have migraine, caffeine intake more than 3 days per week can lead to dependency and increased migraine frequency. I would recommend cutting back on your caffeine intake as best you can. The recommended amount of caffeine is 200-300 mg daily, although migraine patients may experience dependency at even lower doses. While you may notice an increase in headache temporarily, cutting back will be helpful for headaches in the long run. For more information on caffeine and migraine, visit: https://americanmigrainefoundation.org/resource-library/caffeine-and-migraine/   Headache Prevention Strategies:   1. Maintain a headache diary; learn to identify and avoid triggers.  - This can be a simple note where you log when you had a headache, associated symptoms, and medications used - There are several smartphone apps developed to help track migraines: Migraine Buddy, Migraine Monitor, Curelator N1-Headache App   Common triggers include: Emotional triggers: Emotional/Upset family or friends Emotional/Upset occupation Business reversal/success Anticipation anxiety Crisis-serious Post-crisis periodNew job/position   Physical triggers: Vacation Day Weekend Strenuous Exercise High Altitude Location New Move Menstrual Day Physical Illness Oversleep/Not enough sleep Weather changes Light: Photophobia or light sesnitivity treatment involves a balance between desensitization and reduction  in overly strong input. Use dark polarized glasses outside, but not inside. Avoid bright or fluorescent light, but do not dim environment to the point that going into a normally lit room  hurts. Consider FL-41 tint lenses, which reduce the most irritating wavelengths without blocking too much light.  These can be obtained at axonoptics.com or theraspecs.com Foods: see list above.   2. Limit use of acute treatments (over-the-counter medications, triptans, etc.) to no more than 2 days per week or 10 days per month to prevent medication overuse headache (rebound headache).     3. Follow a regular schedule (including weekends and holidays): Don't skip meals. Eat a balanced diet. 8 hours of sleep nightly. Minimize stress. Exercise 30 minutes per day. Being overweight is associated with a 5 times increased risk of chronic migraine. Keep well hydrated and drink 6-8 glasses of water per day.   4. Initiate non-pharmacologic measures at the earliest onset of your headache. Rest and quiet environment. Relax and reduce stress. Breathe2Relax is a free app that can instruct you on    some simple relaxtion and breathing techniques. Http://Dawnbuse.com is a    free website that provides teaching videos on relaxation.  Also, there are  many apps that   can be downloaded for "mindful" relaxation.  An app called YOGA NIDRA will help walk you through mindfulness. Another app called Calm can be downloaded to give you a structured mindfulness guide with daily reminders and skill development. Headspace for guided meditation Mindfulness Based Stress Reduction Online Course: www.palousemindfulness.com Cold compresses.   5. Don't wait!! Take the maximum allowable dosage of prescribed medication at the first sign of migraine.   6. Compliance:  Take prescribed medication regularly as directed and at the first sign of a migraine.   7. Communicate:  Call your physician when problems arise, especially if your headaches change, increase in frequency/severity, or become associated with neurological symptoms (weakness, numbness, slurred speech, etc.). Proceed to emergency room if you experience new or worsening  symptoms or symptoms do not resolve, if you have new neurologic symptoms or if headache is severe, or for any concerning symptom.   8. Headache/pain management therapies: Consider various complementary methods, including medication, behavioral therapy, psychological counselling, biofeedback, massage therapy, acupuncture, dry needling, and other modalities.  Such measures may reduce the need for medications. Counseling for pain management, where patients learn to function and ignore/minimize their pain, seems to work very well.   9. Recommend changing family's attention and focus away from patient's headaches. Instead, emphasize daily activities. If first question of day is 'How are your headaches/Do you have a headache today?', then patient will constantly think about headaches, thus making them worse. Goal is to re-direct attention away from headaches, toward daily activities and other distractions.   10. Helpful Websites: www.AmericanHeadacheSociety.org PatentHood.ch www.headaches.org TightMarket.nl www.achenet.org

## 2024-04-05 NOTE — Progress Notes (Unsigned)
 No chief complaint on file.   HISTORY OF PRESENT ILLNESS:  04/05/24 ALL:  Taylor Henry is a 38 y.o. female here today for follow up for migraines. She was seen in consult with Dr Ines 10/2022 and reports difficulty with menstrual migraines. Discussion of using Nuva ring versus estrogen patch with GYN advised. Rizatriptan  and ondansetron  prescribed for abortive therapy. Nurtec advised daily during menstrual cycle.   Since,    HISTORY (copied from Dr Sharion previous note)   HPI:  Taylor Henry is a 38 y.o. female here as requested by Latisha Medford, MD for migraines. During her menstrual cycle. Ibuprofen  helps. Started a year ago. Right side, pulsating/pounding/throbbing, no auras, nausea, liht sensitivity, moving makes it worse, a dark quiet room helps. No vision changes, not positional or exertional, lasts 24-48 hours. Moderately severe to severe. About a day berfore menstrual cycle. Never had them before. No inciting events, no head trauma, 4 migraine days in a month. No vomiting. And a hangover the next day. No other focal neurologic deficits, associated symptoms, inciting events or modifiable factors.     Reviewed notes, labs and imaging from outside physicians, which showed migraines   Tried: sumatriptan, maxalt , nurtec, ubrelvy   REVIEW OF SYSTEMS: Out of a complete 14 system review of symptoms, the patient complains only of the following symptoms, and all other reviewed systems are negative.   ALLERGIES: Allergies  Allergen Reactions   Codeine Nausea And Vomiting   Penicillins Rash    Has patient had a PCN reaction causing immediate rash, facial/tongue/throat swelling, SOB or lightheadedness with hypotension: Yes Has patient had a PCN reaction causing severe rash involving mucus membranes or skin necrosis: Yes Has patient had a PCN reaction that required hospitalization No Has patient had a PCN reaction occurring within the last 10 years: No If all of the  above answers are NO, then may proceed with Cephalosporin use.     HOME MEDICATIONS: Outpatient Medications Prior to Visit  Medication Sig Dispense Refill   acyclovir (ZOVIRAX) 200 MG capsule Take 200 mg by mouth daily.     hydrOXYzine  (ATARAX /VISTARIL ) 10 MG tablet Take 1 tablet (10 mg total) by mouth 3 (three) times daily as needed. 20 tablet 0   JENCYCLA 0.35 MG tablet Take 1 tablet by mouth daily.     ondansetron  (ZOFRAN -ODT) 4 MG disintegrating tablet Take 1-2 tablets (4-8 mg total) by mouth every 8 (eight) hours as needed. 30 tablet 3   Rimegepant Sulfate (NURTEC) 75 MG TBDP Take 1 tablet (75 mg total) by mouth daily as needed. For migraines. Take as close to onset of migraine as possible. One daily maximum. 16 tablet 0   rizatriptan  (MAXALT -MLT) 10 MG disintegrating tablet Take 1 tablet (10 mg total) by mouth as needed for migraine. May repeat in 2 hours if needed 9 tablet 11   sertraline (ZOLOFT) 100 MG tablet Take 100 mg by mouth at bedtime.     No facility-administered medications prior to visit.     PAST MEDICAL HISTORY: Past Medical History:  Diagnosis Date   Anemia    hx of anemia   Anxiety    HSV (herpes simplex virus) infection    ORAL   Hypoglycemia    Normal vaginal delivery 02/2016   Recent URI    Wears glasses      PAST SURGICAL HISTORY: Past Surgical History:  Procedure Laterality Date   ADENOIDECTOMY  1991   LAPAROSCOPIC TUBAL LIGATION Bilateral 07/02/2016   Procedure:  LAPAROSCOPIC TUBAL LIGATION with filshie clips;  Surgeon: Truman Corona, MD;  Location: [facility];  Service: Gynecology;  Laterality: Bilateral;   TONSILLECTOMY       FAMILY HISTORY: Family History  Problem Relation Age of Onset   Breast cancer Mother 36   Hodgkin's lymphoma Mother    Hyperlipidemia Father    Hypertension Father    Heart disease Paternal Grandmother    Hypertension Paternal Grandmother    Diabetes Neg Hx    Migraines Neg Hx      SOCIAL  HISTORY: Social History   Socioeconomic History   Marital status: Divorced    Spouse name: Not on file   Number of children: Not on file   Years of education: Not on file   Highest education level: Not on file  Occupational History   Not on file  Tobacco Use   Smoking status: Never   Smokeless tobacco: Never  Vaping Use   Vaping status: Never Used  Substance and Sexual Activity   Alcohol use: No   Drug use: No   Sexual activity: Yes    Birth control/protection: OCP  Other Topics Concern   Not on file  Social History Narrative   Lives alone with her children   Right handed   Caffeine: at least 2 cups/day (coffee in AM and a caffeine drink with dinner)   Social Drivers of Corporate investment banker Strain: Not on file  Food Insecurity: Not on file  Transportation Needs: Not on file  Physical Activity: Not on file  Stress: Not on file  Social Connections: Not on file  Intimate Partner Violence: Not on file     PHYSICAL EXAM  There were no vitals filed for this visit. There is no height or weight on file to calculate BMI.  Generalized: Well developed, in no acute distress  Cardiology: normal rate and rhythm, no murmur auscultated  Respiratory: clear to auscultation bilaterally    Neurological examination  Mentation: Alert oriented to time, place, history taking. Follows all commands speech and language fluent Cranial nerve II-XII: Pupils were equal round reactive to light. Extraocular movements were full, visual field were full on confrontational test. Facial sensation and strength were normal. Uvula tongue midline. Head turning and shoulder shrug  were normal and symmetric. Motor: The motor testing reveals 5 over 5 strength of all 4 extremities. Good symmetric motor tone is noted throughout.  Sensory: Sensory testing is intact to soft touch on all 4 extremities. No evidence of extinction is noted.  Coordination: Cerebellar testing reveals good finger-nose-finger  and heel-to-shin bilaterally.  Gait and station: Gait is normal. Tandem gait is normal. Romberg is negative. No drift is seen.  Reflexes: Deep tendon reflexes are symmetric and normal bilaterally.    DIAGNOSTIC DATA (LABS, IMAGING, TESTING) - I reviewed patient records, labs, notes, testing and imaging myself where available.  Lab Results  Component Value Date   WBC 6.4 10/27/2022   HGB 13.3 10/27/2022   HCT 41.2 10/27/2022   MCV 86 10/27/2022   PLT 273 10/27/2022      Component Value Date/Time   NA 140 10/27/2022 1031   K 4.4 10/27/2022 1031   CL 107 (H) 10/27/2022 1031   CO2 23 10/27/2022 1031   GLUCOSE 75 10/27/2022 1031   GLUCOSE 102 (H) 02/25/2017 1449   BUN 10 10/27/2022 1031   CREATININE 0.77 10/27/2022 1031   CALCIUM 8.8 10/27/2022 1031   PROT 7.2 10/27/2022 1031   ALBUMIN 4.1  10/27/2022 1031   AST 14 10/27/2022 1031   ALT 15 10/27/2022 1031   ALKPHOS 92 10/27/2022 1031   BILITOT <0.2 10/27/2022 1031   GFRNONAA >60 02/25/2017 1449   GFRAA >60 02/25/2017 1449   No results found for: CHOL, HDL, LDLCALC, LDLDIRECT, TRIG, CHOLHDL No results found for: YHAJ8R No results found for: VITAMINB12 No results found for: TSH      No data to display               No data to display           ASSESSMENT AND PLAN  38 y.o. year old female  has a past medical history of Anemia, Anxiety, HSV (herpes simplex virus) infection, Hypoglycemia, Normal vaginal delivery (02/2016), Recent URI, and Wears glasses. here with    No diagnosis found.   Taylor Henry is doing well. We will continue rizatriptan , ondancetron and Nurtec as prescribed for menstrual migraine abortion therapy. Healthy lifestyle habits encouraged. She will follow up with PCP as directed. She will return to see me in 1 year, sooner if needed. She verbalizes understanding and agreement with this plan.    No orders of the defined types were placed in this encounter.    No  orders of the defined types were placed in this encounter.    Greig Forbes, MSN, FNP-C 04/05/2024, 8:58 AM  [facility] [address] [place], [place] [address] [phone]

## 2024-04-06 ENCOUNTER — Ambulatory Visit (INDEPENDENT_AMBULATORY_CARE_PROVIDER_SITE_OTHER): Admitting: Family Medicine

## 2024-04-06 ENCOUNTER — Encounter: Payer: Self-pay | Admitting: Family Medicine

## 2024-04-06 VITALS — BP 122/76 | HR 72 | Ht 66.0 in | Wt 246.0 lb

## 2024-04-06 DIAGNOSIS — R110 Nausea: Secondary | ICD-10-CM

## 2024-04-06 DIAGNOSIS — G43009 Migraine without aura, not intractable, without status migrainosus: Secondary | ICD-10-CM

## 2024-04-06 DIAGNOSIS — G43829 Menstrual migraine, not intractable, without status migrainosus: Secondary | ICD-10-CM

## 2024-04-06 MED ORDER — NURTEC 75 MG PO TBDP
75.0000 mg | ORAL_TABLET | Freq: Every day | ORAL | 6 refills | Status: AC | PRN
Start: 2024-04-06 — End: ?

## 2024-04-10 ENCOUNTER — Other Ambulatory Visit (HOSPITAL_COMMUNITY): Payer: Self-pay

## 2024-04-12 ENCOUNTER — Other Ambulatory Visit (HOSPITAL_COMMUNITY): Payer: Self-pay

## 2024-05-08 ENCOUNTER — Telehealth: Payer: Self-pay

## 2024-05-08 NOTE — Telephone Encounter (Signed)
*  [facility]  Pharmacy Patient Advocate Encounter   Received notification from CoverMyMeds that prior authorization for Nurtec 75MG  dispersible tablets   is required/requested.   Insurance verification completed.   The patient is insured through [employer].   Per test claim: PA required; PA started via CoverMyMeds. KEY [id number] . Waiting for clinical questions to populate.

## 2024-05-08 NOTE — Telephone Encounter (Signed)
 Pharmacy Patient Advocate Encounter  Received notification from [employer] that Prior Authorization for Nurtec 75MG  dispersible tablets   has been APPROVED from 05/08/2024 to 05/08/2025

## 2024-05-08 NOTE — Telephone Encounter (Signed)
 Clinical questions have populated and been submitted to patients plan.

## 2025-04-09 ENCOUNTER — Telehealth: Admitting: Family Medicine
